# Patient Record
Sex: Female | Born: 2000 | Race: White | Hispanic: No | Marital: Single | Smoking: Never smoker
Health system: Southern US, Community
[De-identification: ages and names within clinical notes are randomized; demographics above are authoritative.]

## PROBLEM LIST (undated history)

## (undated) DIAGNOSIS — J45909 Unspecified asthma, uncomplicated: Secondary | ICD-10-CM

## (undated) DIAGNOSIS — T7840XA Allergy, unspecified, initial encounter: Secondary | ICD-10-CM

## (undated) HISTORY — PX: KNEE DISLOCATION SURGERY: SHX689

## (undated) HISTORY — DX: Allergy, unspecified, initial encounter: T78.40XA

## (undated) HISTORY — DX: Unspecified asthma, uncomplicated: J45.909

---
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
## 2018-02-24 DIAGNOSIS — R05 Cough: Secondary | ICD-10-CM | POA: Diagnosis not present

## 2018-02-24 DIAGNOSIS — J4531 Mild persistent asthma with (acute) exacerbation: Secondary | ICD-10-CM | POA: Diagnosis not present

## 2018-02-24 DIAGNOSIS — J018 Other acute sinusitis: Secondary | ICD-10-CM | POA: Diagnosis not present

## 2018-02-24 DIAGNOSIS — Z6834 Body mass index (BMI) 34.0-34.9, adult: Secondary | ICD-10-CM | POA: Diagnosis not present

## 2018-02-24 DIAGNOSIS — R070 Pain in throat: Secondary | ICD-10-CM | POA: Diagnosis not present

## 2018-02-24 DIAGNOSIS — E669 Obesity, unspecified: Secondary | ICD-10-CM | POA: Diagnosis not present

## 2018-02-24 DIAGNOSIS — Z6838 Body mass index (BMI) 38.0-38.9, adult: Secondary | ICD-10-CM | POA: Diagnosis not present

## 2018-04-02 DIAGNOSIS — J4530 Mild persistent asthma, uncomplicated: Secondary | ICD-10-CM | POA: Diagnosis not present

## 2018-04-02 DIAGNOSIS — J45902 Unspecified asthma with status asthmaticus: Secondary | ICD-10-CM | POA: Diagnosis not present

## 2018-04-02 DIAGNOSIS — Z6839 Body mass index (BMI) 39.0-39.9, adult: Secondary | ICD-10-CM | POA: Diagnosis not present

## 2018-06-03 ENCOUNTER — Other Ambulatory Visit: Payer: Self-pay

## 2018-06-03 ENCOUNTER — Emergency Department (HOSPITAL_COMMUNITY)
Admission: EM | Admit: 2018-06-03 | Discharge: 2018-06-03 | Disposition: A | Discharge status: Home / Self Care | Payer: Medicaid Other | Attending: Emergency Medicine | Admitting: Emergency Medicine

## 2018-06-03 ENCOUNTER — Encounter (HOSPITAL_COMMUNITY): Payer: Self-pay | Admitting: *Deleted

## 2018-06-03 DIAGNOSIS — R1031 Right lower quadrant pain: Secondary | ICD-10-CM | POA: Diagnosis not present

## 2018-06-03 DIAGNOSIS — K922 Gastrointestinal hemorrhage, unspecified: Secondary | ICD-10-CM | POA: Insufficient documentation

## 2018-06-03 DIAGNOSIS — Z79899 Other long term (current) drug therapy: Secondary | ICD-10-CM | POA: Diagnosis not present

## 2018-06-03 DIAGNOSIS — K625 Hemorrhage of anus and rectum: Secondary | ICD-10-CM | POA: Diagnosis present

## 2018-06-03 LAB — URINALYSIS, ROUTINE W REFLEX MICROSCOPIC
Bilirubin Urine: NEGATIVE
Glucose, UA: NEGATIVE mg/dL
Hgb urine dipstick: NEGATIVE
Ketones, ur: NEGATIVE mg/dL
Leukocytes,Ua: NEGATIVE
Nitrite: NEGATIVE
Protein, ur: NEGATIVE mg/dL
Specific Gravity, Urine: 1.011 (ref 1.005–1.030)
pH: 5 (ref 5.0–8.0)

## 2018-06-03 LAB — COMPREHENSIVE METABOLIC PANEL
ALT: 23 U/L (ref 0–44)
AST: 23 U/L (ref 15–41)
Albumin: 4.8 g/dL (ref 3.5–5.0)
Alkaline Phosphatase: 80 U/L (ref 38–126)
Anion gap: 14 (ref 5–15)
BUN: 9 mg/dL (ref 6–20)
CO2: 22 mmol/L (ref 22–32)
Calcium: 10.1 mg/dL (ref 8.9–10.3)
Chloride: 103 mmol/L (ref 98–111)
Creatinine, Ser: 0.85 mg/dL (ref 0.44–1.00)
GFR calc Af Amer: >60 mL/min (ref >60)
GFR calc non Af Amer: >60 mL/min (ref >60)
Glucose, Bld: 78 mg/dL (ref 70–99)
Potassium: 4.1 mmol/L (ref 3.5–5.1)
Sodium: 139 mmol/L (ref 135–145)
Total Bilirubin: 0.5 mg/dL (ref 0.3–1.2)
Total Protein: 8.4 g/dL — ABNORMAL HIGH (ref 6.5–8.1)

## 2018-06-03 LAB — CBC WITH DIFFERENTIAL/PLATELET
Abs Immature Granulocytes: 0.02 10*3/uL (ref 0.00–0.07)
Basophils Absolute: 0 10*3/uL (ref 0.0–0.1)
Basophils Relative: 0 %
Eosinophils Absolute: 0.1 10*3/uL (ref 0.0–0.5)
Eosinophils Relative: 1 %
HCT: 43 % (ref 36.0–46.0)
Hemoglobin: 14.3 g/dL (ref 12.0–15.0)
Immature Granulocytes: 0 %
Lymphocytes Relative: 33 %
Lymphs Abs: 3.2 10*3/uL (ref 0.7–4.0)
MCH: 30 pg (ref 26.0–34.0)
MCHC: 33.3 g/dL (ref 30.0–36.0)
MCV: 90.1 fL (ref 80.0–100.0)
Monocytes Absolute: 0.7 10*3/uL (ref 0.1–1.0)
Monocytes Relative: 7 %
Neutro Abs: 5.8 10*3/uL (ref 1.7–7.7)
Neutrophils Relative %: 59 %
Platelets: 314 10*3/uL (ref 150–400)
RBC: 4.77 MIL/uL (ref 3.87–5.11)
RDW: 11.8 % (ref 11.5–15.5)
WBC: 9.8 10*3/uL (ref 4.0–10.5)
nRBC: 0 % (ref 0.0–0.2)

## 2018-06-03 LAB — POC OCCULT BLOOD, ED: Fecal Occult Bld: NEGATIVE

## 2018-06-03 LAB — HCG, QUANTITATIVE, PREGNANCY: hCG, Beta Chain, Quant, S: <1 m[IU]/mL (ref <5)

## 2018-06-03 LAB — LIPASE, BLOOD: Lipase: 27 U/L (ref 11–51)

## 2018-06-03 NOTE — Discharge Instructions (Signed)
You have been diagnosed today with Lower Gastrointestinal Bleeding.  At this time there does not appear to be the presence of an emergent medical condition, however there is always the potential for conditions to change. Please read and follow the below instructions.  Please return to the Emergency Department immediately for any new or worsening symptoms. Please be sure to follow up with your Primary Care Provider within one week regarding your visit today; please call their office to schedule an appointment even if you are feeling better for a follow-up visit. Please call the GI specialist at [facility] gastroenterology to schedule a follow-up appointment.  Call their office tomorrow to schedule an appointment for within the next week.  Return to the emergency department for any new or worsening symptoms including pain. As we discussed due to your allergy to triamcinolone we will hold off on prescribing you Anusol to treat hemorrhoids at this time as do not make your condition worse however we suggest he discuss this treatment with your primary care provider as well as the gastroenterologist as it may be helpful.  Get help right away if: You have new or increased rectal bleeding. You have black or dark red stools. You vomit blood or something that looks like coffee grounds. You have pain or tenderness in your abdomen. You have a fever. You feel weak. You feel nauseous. You faint. You have severe pain in your rectum. You cannot have a bowel movement. Any new/concerning or worsening symptoms  Please read the additional information packets attached to your discharge summary.

## 2018-06-03 NOTE — ED Notes (Signed)
Patient verbalizes understanding of discharge instructions. Opportunity for questioning and answers were provided. Armband removed by staff, pt discharged from ED.  

## 2018-06-03 NOTE — ED Provider Notes (Signed)
[facility] EMERGENCY DEPARTMENT Provider Note   CSN: [id number] Arrival date & time: 06/03/18  1757    History   Chief Complaint Chief Complaint  Patient presents with  . GI Bleeding    HPI Anna Roberts is a 18 y.o. female presenting today with painless bright red blood per rectum that began on 05/30/2018.  Patient reports that she first noticed a small amount of bright red blood on her toilet paper on 05/30/2018.  Patient reports that with each bowel movement over the past 4 days she has noticed a small amount of bright red blood in the toilet.  She denies any pain associated with her bleeding and she has never had this before.  Patient denies any history of hemorrhoids, fissures or injury.  Patient denies any dizziness, lightheadedness, chest pain, shortness of breath or additional concerns.  Patient reports that she is an otherwise healthy 18 year old female without chronic medical conditions.  On review of symptoms patient does report that she had a cramping right lower quadrant pain with some nausea that occurred 2 days ago, she reports it lasted a few moments before self resolving and has not reoccurred.  Patient denies any family history of gastrointestinal cancers.     HPI  History reviewed. No pertinent past medical history.  Patient Active Problem List   Diagnosis Date Noted  . STREP THROAT 04/03/2009  . SORE THROAT 04/03/2009  . OBESITY, UNSPECIFIED 05/26/2008  . ALLERGIC RHINITIS 05/26/2008     OB History   No obstetric history on file.      Home Medications    Prior to Admission medications   Medication Sig Start Date End Date Taking? Authorizing Provider  diphenhydrAMINE (BENADRYL) 25 MG tablet Take 50 mg by mouth every 6 (six) hours as needed for itching or allergies (cats).   Yes [provider]  etonogestrel (NEXPLANON) 68 MG IMPL implant 1 each by Subdermal route once.   Yes [provider]  ibuprofen (ADVIL) 200 MG  tablet Take 400 mg by mouth every 6 (six) hours as needed for headache or moderate pain.   Yes [provider]  loratadine (CLARITIN) 10 MG tablet Take 1 tablet by mouth daily.   Yes [provider]  montelukast (SINGULAIR) 10 MG tablet Take 1 tablet by mouth daily. 05/24/18  Yes [provider]  PROAIR HFA 108 (90 Base) MCG/ACT inhaler Inhale 2 puffs into the lungs every 4 (four) hours as needed for wheezing or shortness of breath. 04/02/18  Yes [provider]  SYMBICORT 160-4.5 MCG/ACT inhaler Inhale 2 puffs into the lungs 2 (two) times daily. 04/30/18  Yes [provider]    Family History History reviewed. No pertinent family history.  Social History Social History   Tobacco Use  . Smoking status: Not on file  Substance Use Topics  . Alcohol use: Not on file  . Drug use: Not on file     Allergies   Triamcinolone acetonide   Review of Systems Review of Systems  Constitutional: Negative.  Negative for chills and fever.  Respiratory: Negative.  Negative for cough and shortness of breath.   Cardiovascular: Negative.  Negative for chest pain.  Gastrointestinal: Positive for anal bleeding and blood in stool. Negative for abdominal distention, abdominal pain (Brief moment of RLQ cramping 2 days ago), diarrhea, nausea and vomiting.  Genitourinary: Negative.  Negative for dysuria, flank pain, hematuria, pelvic pain, vaginal bleeding and vaginal discharge.  Neurological: Negative.  Negative for dizziness,  syncope, weakness, light-headedness and headaches.  All other systems reviewed and are negative.  Physical Exam Updated Vital Signs BP 133/76 (BP Location: Right Arm)   Pulse 86   Temp 98.3 F (36.8 C)   Resp 18   SpO2 100%   Physical Exam Constitutional:      General: She is not in acute distress.    Appearance: Normal appearance. She is well-developed. She is obese. She is not ill-appearing or diaphoretic.  HENT:     Head:  Normocephalic and atraumatic.     Right Ear: External ear normal.     Left Ear: External ear normal.     Nose: Nose normal.     Mouth/Throat:     Mouth: Mucous membranes are moist.     Pharynx: Oropharynx is clear.  Eyes:     General: Vision grossly intact. Gaze aligned appropriately.     Pupils: Pupils are equal, round, and reactive to light.  Neck:     Musculoskeletal: Normal range of motion.     Trachea: Trachea and phonation normal. No tracheal deviation.  Cardiovascular:     Rate and Rhythm: Normal rate and regular rhythm.     Pulses: Normal pulses.     Heart sounds: Normal heart sounds.  Pulmonary:     Effort: Pulmonary effort is normal. No respiratory distress.     Breath sounds: Normal breath sounds.  Abdominal:     General: Bowel sounds are normal. There is no distension.     Palpations: Abdomen is soft.     Tenderness: There is no abdominal tenderness. There is no right CVA tenderness, left CVA tenderness, guarding or rebound. Negative signs include Murphy's sign, Rovsing's sign and McBurney's sign.     Comments: Obese abdomen  Genitourinary:    Comments: Rectal examination chaperoned by [facility] RN.  No gross blood on examination.  No external hemorrhoids, fissures or signs of injury.  Internal examination with appropriate tone, no palpable hemorrhoids fissures or other abnormalities. - Pelvic examination deferred by patient. Musculoskeletal: Normal range of motion.  Skin:    General: Skin is warm and dry.  Neurological:     Mental Status: She is alert.     GCS: GCS eye subscore is 4. GCS verbal subscore is 5. GCS motor subscore is 6.     Comments: Speech is clear and goal oriented, follows commands Major Cranial nerves without deficit, no facial droop Moves extremities without ataxia, coordination intact  Psychiatric:        Behavior: Behavior normal.    ED Treatments / Results  Labs (all labs ordered are listed, but only abnormal results are displayed) Labs  Reviewed  COMPREHENSIVE METABOLIC PANEL - Abnormal; Notable for the following components:      Result Value   Total Protein 8.4 (*)    All other components within normal limits  URINALYSIS, ROUTINE W REFLEX MICROSCOPIC - Abnormal; Notable for the following components:   Color, Urine STRAW (*)    All other components within normal limits  CBC WITH DIFFERENTIAL/PLATELET  LIPASE, BLOOD  HCG, QUANTITATIVE, PREGNANCY  POC OCCULT BLOOD, ED    EKG None  Radiology No results found.  Procedures Procedures (including critical care time)  Medications Ordered in ED Medications - No data to display   Initial Impression / Assessment and Plan / ED Course  I have reviewed the triage vital signs and the nursing notes.  Pertinent labs & imaging results that were available during my care of the patient were  reviewed by me and considered in my medical decision making (see chart for details).    18 year old female presenting with small amounts of painless bright red blood per rectum x4 days.  She did note a brief episode of RLQ cramping sensation 2 days ago that has not reoccurred.  Physical examination today reveals a well-appearing obese female in no acute distress.  Abdominal examination today reveals a soft nontender abdomen without distention or peritoneal signs.  She has no right lower quadrant tenderness on my examination.  Rectal examination without evidence of bleeding, fissures, hemorrhoids or other abnormalities. - CBC within normal limits CMP nonacute Lipase within normal limits Beta-hCG negative Hemoccult negative Urinalysis unremarkable - Vital signs within normal limits. - Case discussed with Dr. Particia NearingHaviland who recommends Anusol and GI follow-up, no further imaging or work-up indicated at this time.  Suspect patient with possible internal hemorrhoid. - Plan of care discussed with the patient, she is on reexamination of the abdomen she has a soft nontender abdomen without  distention or peritoneal signs.  Shared decision making performed regarding CT abdomen pelvis, low suspicion for appendicitis, diverticulitis extraction or other acute intra-abdominal pathologies of patient's symptoms additionally CT abdomen pelvis would be low yield for evaluation of GI bleed, patient wishes to be discharged at this time with GI referral and does not want to have CT scan today. I have low suspicion for appendicitis, diverticulitis, obstruction or other acute abdominal pelvic etiologies patient symptoms today.  Unfortunately patient has allergy to triamcinolone and has never had other steroids in the past.  She has elected to wait until PCP and GI follow-up regarding her bleeding prior to starting Anusol.  At this time there does not appear to be any evidence of an acute emergency medical condition and the patient appears stable for discharge with appropriate outpatient follow up. Diagnosis was discussed with patient who verbalizes understanding of care plan and is agreeable to discharge. I have discussed return precautions with patient who verbalizes understanding of return precautions. Patient encouraged to follow-up with their PCP and GI. All questions answered.  Patient has been discharged in good condition.  Patient's case discussed with Dr. Particia NearingHaviland who agrees with plan to discharge with follow-up.   Note: Portions of this report may have been transcribed using voice recognition software. Every effort was made to ensure accuracy; however, inadvertent computerized transcription errors may still be present. Final Clinical Impressions(s) / ED Diagnoses   Final diagnoses:  Lower GI bleed    ED Discharge Orders    None       Elizabeth PalauMorelli, Salaam Battershell A, PA-C 06/03/18 2303    Jacalyn LefevreHaviland, Julie, MD 06/06/18 [phone]

## 2018-06-03 NOTE — ED Triage Notes (Signed)
Pt in c/o blood in her stool for the last week, reports the first time she just noted it on her TP and it gradually increased and began to notice the blood in the toilet during her bowel movements, denies rectal pain during bowel movement, c/o RLQ pain that she describes as cramping though, reports nausea as well

## 2018-06-04 ENCOUNTER — Telehealth: Payer: Self-pay

## 2018-06-04 NOTE — Telephone Encounter (Signed)
Patient returned phone call and reported continued concerns with abdominal pain, nausea, and bleeding. States a CT scan was not completed and she still does not know what is causing her concerns. PCP is unavailable for same day appt. Scheduled ED f/u appt with PCP on 06/04/18 @ 1020.

## 2018-06-04 NOTE — Telephone Encounter (Signed)
Patient seen in [facility] for lower GI bleed. Discharged with referral for GI and advised to f/u with PCP. GI appt scheduled on 06/08/18. Attempted to reach patient to schedule ED f/u appt with PCP. Attempt unsuccessful. VM is full.

## 2018-06-05 ENCOUNTER — Other Ambulatory Visit: Payer: Self-pay

## 2018-06-05 ENCOUNTER — Ambulatory Visit (INDEPENDENT_AMBULATORY_CARE_PROVIDER_SITE_OTHER): Payer: Medicaid Other | Admitting: Family Medicine

## 2018-06-05 ENCOUNTER — Encounter: Payer: Self-pay | Admitting: *Deleted

## 2018-06-05 ENCOUNTER — Encounter: Payer: Self-pay | Admitting: Family Medicine

## 2018-06-05 VITALS — BP 106/62 | HR 87 | Temp 98.6°F | Ht 70.0 in | Wt 275.2 lb

## 2018-06-05 DIAGNOSIS — R1033 Periumbilical pain: Secondary | ICD-10-CM | POA: Insufficient documentation

## 2018-06-05 DIAGNOSIS — K648 Other hemorrhoids: Secondary | ICD-10-CM | POA: Insufficient documentation

## 2018-06-05 MED ORDER — HYDROCORTISONE 2.5 % RE CREA: 1.0000 "application " | TOPICAL_CREAM | Freq: Two times a day (BID) | RECTAL | 0 refills | Status: DC

## 2018-06-05 NOTE — Assessment & Plan Note (Signed)
No clear constipation, but likely brbpr due to internal hemorrhoid. Pt obese and weight may be contributing.  Treat with topical hydrocortisone topical internally, fiber and water.  See discussion on abdominal pain.

## 2018-06-05 NOTE — Assessment & Plan Note (Signed)
Not explained by int hemorrhoid.  neg labs , UA, upreg in ER.  No clear red flags suggesting need for urgent CT.  Will await GI recommendations.

## 2018-06-05 NOTE — Patient Instructions (Addendum)
Apply topical steroid to rectum as directed. Increase water and fiber in diet.  Keep virtual appointment with GI next week.  If severe abdominal pain go to ER.

## 2018-06-05 NOTE — Progress Notes (Signed)
VIRTUAL VISIT Due to national recommendations of social distancing due to COVID 19, a virtual visit is felt to be most appropriate for this patient at this time.   I connected with the patient on 06/05/18 at 10:20 AM EDT by virtual telehealth platform and verified that I am speaking with the correct person using two identifiers.   I discussed the limitations, risks, security and privacy concerns of performing an evaluation and management service by  virtual telehealth platform and the availability of in person appointments. I also discussed with the patient that there may be a patient responsible charge related to this service. The patient expressed understanding and agreed to proceed.  Patient location: Home Provider [facility] Participants: Kerby NoraAmy Brittane Grudzinski and Lovett CalenderSarah K Thum   Chief Complaint  Patient presents with  . Follow-up    ED-Still abdominal pain/nausea/bleeding    History of Present Illness:  18 year old female presents for ER follow up.  Seen in ER on 06/03/2018 for lower GI bleed and cramping in right lower quadrant Summary of presentation to ER copied as follows: Patient reports that she first noticed a small amount of bright red blood on her toilet paper on 05/30/2018.  Patient reports that with each bowel movement over the past 4 days she has noticed a small amount of bright red blood in the toilet.  She denies any pain associated with her bleeding and she has never had this before.  Patient denies any history of hemorrhoids, fissures or injury.   no constipation, nod ierrheaPatient denies any dizziness, lightheadedness, chest pain, shortness of breath or additional concerns.   On review of symptoms patient does report that she had a cramping right lower quadrant pain with some nausea that occurred 2 days ago, she reports it lasted a few moments before self resolving.   CBC, CMET, lipase, UA, Upreg and POC stool test .. all negative Exam in ER : No gross blood on  examination.  No external hemorrhoids, fissures or signs of injury.  Internal examination with appropriate tone, no palpable hemorrhoids fissures or other abnormalities. NO INTERNAL EXAM VISUALLY PERFORMED.  Could not treat with anusol given hx of steroid allergy. Recommended follow up with PCP and referral to GI.  06/05/18   She has continued to note brbpr  drops in toilet.. more than a few drops, but less than a 1/2 cup. No diarrhea, no constipation ( one BM a day, no straining), fairly soft stool). occ nausea, no emesis.  no rectal pain.  No fever. She continues to have pain before and after BM.. right mid lto upper abdomen now.. dull pain.  Bloating, cramping pain increasing some... on pain scale 5/10. No flu like symptoms.  No personal history of GI issues. Family history of GI issue: Mom and aunt with extremal hemorrhoids..    Has virtual visit with GI on 06/08/2018   After beginning the appointment virtually.. pt was brought in to office for exam and anoscopy.  COVID 19 screen No recent travel or known exposure to COVID19 The patient denies respiratory symptoms of COVID 19 at this time.  The importance of social distancing was discussed today.   Review of Systems  Constitutional: Negative for chills and fever.  HENT: Negative for congestion and ear pain.   Eyes: Negative for pain and redness.  Respiratory: Negative for cough and shortness of breath.   Cardiovascular: Negative for chest pain, palpitations and leg swelling.  Gastrointestinal: Positive for abdominal pain and blood in stool.  Negative for constipation, diarrhea, nausea and vomiting.  Genitourinary: Negative for dysuria.  Musculoskeletal: Negative for falls and myalgias.  Skin: Negative for rash.  Neurological: Negative for dizziness.  Psychiatric/Behavioral: Negative for depression. The patient is not nervous/anxious.       History reviewed. No pertinent past medical history.  reports that she is a  non-smoker but has been exposed to tobacco smoke. She has never used smokeless tobacco. She reports that she does not drink alcohol.   Current Outpatient Medications:  .  diphenhydrAMINE (BENADRYL) 25 MG tablet, Take 50 mg by mouth every 6 (six) hours as needed for itching or allergies (cats)., Disp: , Rfl:  .  etonogestrel (NEXPLANON) 68 MG IMPL implant, 1 each by Subdermal route once., Disp: , Rfl:  .  ibuprofen (ADVIL) 200 MG tablet, Take 400 mg by mouth every 6 (six) hours as needed for headache or moderate pain., Disp: , Rfl:  .  loratadine (CLARITIN) 10 MG tablet, Take 1 tablet by mouth daily., Disp: , Rfl:  .  montelukast (SINGULAIR) 10 MG tablet, Take 1 tablet by mouth daily., Disp: , Rfl:  .  PROAIR HFA 108 (90 Base) MCG/ACT inhaler, Inhale 2 puffs into the lungs every 4 (four) hours as needed for wheezing or shortness of breath., Disp: , Rfl:  .  SYMBICORT 160-4.5 MCG/ACT inhaler, Inhale 2 puffs into the lungs 2 (two) times daily., Disp: , Rfl:    Observations/Objective: Pulse (!) 112, height 5\' 10"  (1.778 m), weight 275 lb 4 oz (124.9 kg).  Physical Exam Constitutional:      General: She is not in acute distress.    Appearance: Normal appearance. She is well-developed. She is not ill-appearing or toxic-appearing.  HENT:     Head: Normocephalic.     Right Ear: Hearing, tympanic membrane, ear canal and external ear normal. Tympanic membrane is not erythematous, retracted or bulging.     Left Ear: Hearing, tympanic membrane, ear canal and external ear normal. Tympanic membrane is not erythematous, retracted or bulging.     Nose: No mucosal edema or rhinorrhea.     Right Sinus: No maxillary sinus tenderness or frontal sinus tenderness.     Left Sinus: No maxillary sinus tenderness or frontal sinus tenderness.     Mouth/Throat:     Pharynx: Uvula midline.  Eyes:     General: Lids are normal. Lids are everted, no foreign bodies appreciated.     Conjunctiva/sclera: Conjunctivae  normal.     Pupils: Pupils are equal, round, and reactive to light.  Neck:     Musculoskeletal: Normal range of motion and neck supple.     Thyroid: No thyroid mass or thyromegaly.     Vascular: No carotid bruit.     Trachea: Trachea normal.  Cardiovascular:     Rate and Rhythm: Normal rate and regular rhythm.     Pulses: Normal pulses.     Heart sounds: Normal heart sounds, S1 normal and S2 normal. No murmur. No friction rub. No gallop.   Pulmonary:     Effort: Pulmonary effort is normal. No tachypnea or respiratory distress.     Breath sounds: Normal breath sounds. No decreased breath sounds, wheezing, rhonchi or rales.  Abdominal:     General: Bowel sounds are normal.     Palpations: Abdomen is soft.     Tenderness: There is abdominal tenderness in the periumbilical area. There is no guarding or rebound.     Hernia: No hernia is present.  Genitourinary:  Rectum: Guaiac result negative. Internal hemorrhoid present. No mass, tenderness, anal fissure or external hemorrhoid.     Comments:   Procedure: Anoscopy: After explaining the procedure, informed consent was obtained.   Using the anoscope instrument, anoscopy was carried out.  Proceeded to 2-3 cm.  Findings:non bleeding grade 1 hemorrhoids No complications were encountered;  the procedure was well  tolerated.    Skin:    General: Skin is warm and dry.     Findings: No rash.  Neurological:     Mental Status: She is alert.  Psychiatric:        Mood and Affect: Mood is not anxious or depressed.        Speech: Speech normal.        Behavior: Behavior normal. Behavior is cooperative.        Thought Content: Thought content normal.        Judgment: Judgment normal.      Assessment and Plan Internal hemorrhoid No clear constipation, but likely brbpr due to internal hemorrhoid. Pt obese and weight may be contributing.  Treat with topical hydrocortisone topical internally, fiber and water.  See discussion on abdominal  pain.     Abdominal pain, periumbilical Not explained by int hemorrhoid.  neg labs , UA, upreg in ER.  No clear red flags suggesting need for urgent CT.  Will await GI recommendations.     I discussed the assessment and treatment plan with the patient. The patient was provided an opportunity to ask questions and all were answered. The patient agreed with the plan and demonstrated an understanding of the instructions.   The patient was advised to call back or seek an in-person evaluation if the symptoms worsen or if the condition fails to improve as anticipated.     Kerby Nora, MD

## 2018-06-08 ENCOUNTER — Other Ambulatory Visit: Payer: Self-pay

## 2018-06-08 ENCOUNTER — Ambulatory Visit (INDEPENDENT_AMBULATORY_CARE_PROVIDER_SITE_OTHER): Payer: Medicaid Other | Admitting: Gastroenterology

## 2018-06-08 ENCOUNTER — Encounter: Payer: Self-pay | Admitting: Gastroenterology

## 2018-06-08 VITALS — Ht 70.0 in | Wt 275.0 lb

## 2018-06-08 DIAGNOSIS — R1084 Generalized abdominal pain: Secondary | ICD-10-CM | POA: Diagnosis not present

## 2018-06-08 DIAGNOSIS — K625 Hemorrhage of anus and rectum: Secondary | ICD-10-CM

## 2018-06-08 MED ORDER — NA SULFATE-K SULFATE-MG SULF 17.5-3.13-1.6 GM/177ML PO SOLN: 1.0000 | ORAL | 0 refills | Status: DC

## 2018-06-08 MED ORDER — DICYCLOMINE HCL 20 MG PO TABS: 20.0000 mg | ORAL_TABLET | Freq: Three times a day (TID) | ORAL | 3 refills | Status: DC

## 2018-06-08 NOTE — Progress Notes (Signed)
TELEHEALTH VISIT  Referring Provider: Excell SeltzerBedsole, Amy E, MD Primary Care Physician:  Excell SeltzerBedsole, Amy E, MD   Tele-visit due to COVID-19 pandemic Patient requested visit virtually, consented to the virtual encounter via video enabled telemedicine application (Zoom, converted to audio call due to failure to video to work) Contact made at: 2:21PM 06/08/18 Patient verified by name and date of birth Location of patient: Home Location provider: [facility] Names of persons participating: Me, patient, Deneise LeverDesiree Brown CMA Time spent on telehealth visit: 28 minutes I discussed the limitations of evaluation and management by telemedicine. The patient expressed understanding and agreed to proceed.  Reason for Consultation: Lower GI bleed   IMPRESSION:  Abdominal pain Painless rectal bleeding Internal hemorrhoids  Will start evaluation for cause of pain and bleeding with colonoscopy +/- EGD. If nondiagnostic, or symptoms progress prior to our ability to schedule endoscopic evaluation, will proceed with contrasted CT scan of the abd/pelvis.    PLAN: Trial of Bentyl 20 mg QID PRN pain Continue Anusol HC at this time Colonoscopy and upper endoscopy CT of the abd/pelvis with contrast if symptoms progress prior to endoscopy   I consented the patient discussing the risks, benefits, and alternatives to endoscopic evaluation. In particular, we discussed the risks that include, but are not limited to, reaction to medication, cardiopulmonary compromise, bleeding requiring blood transfusion, aspiration resulting in pneumonia, perforation requiring surgery, lack of diagnosis, severe illness requiring hospitalization, and even death. We reviewed the risk of missed lesion including polyps or even cancer. The patient acknowledges these risks and asks that we proceed.      HPI: Anna Roberts is a 18 y.o. female referred by Dr. Ermalene SearingBedsole for further evaluation of abdominal pain.  The history is  obtained through the patient and review of her electronic health record. 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   She was seen in the ED 06/03/2022 bright red blood with every bowel movement and mid right to lower right-sided abdominal pain.  The ER rectal exam notes no external hemorrhoids fissures or abnormalities.  No palpable internal hemorrhoids.  They did not give her Anusol because of a history of steroid allergy.  She followed up with Dr. Ermalene SearingBedsole 06/05/2018.  She was continuing to have bright red blood in the toilet bowl.  She reported to Dr. Ermalene SearingBedsole no rectal pain but she did have abdominal pain with defecation.  There is associated bloating.  Dr. Daphine DeutscherBedsole's rectal exam and anoscopy revealed internal hemorrhoids and guaiac negative stool.  There was no mass, tenderness, or fissure noted.  She was treated with topical hydrocortisone, fiber, and diet.  However she did not feel that her abdominal pain could be explained by the hemorrhoid.  The patient reports Intermittent, dull or crampy middle to lower right-sided pain. Increased pain with deep palpation over this area. Present for the last 7 days.  Associated nausea. No change in bowel habits.  No pain or relief of pain with defecation.  Some relief with Tylenol. No change with eating, movement, or position.   No fevers, chills, or night sweats. Normal energy. Appetite fluctuates. Weight is at there baseline of 275 pounds. No other associated symptoms. No identified exacerbating or relieving features.   No abdominal imaging.  No prior endoscopic evaluation. Labs from 06/03/18 showed a normal CMP including normal LFTs and normal lipase. CBC was normal with WBC 9.8, hgb 14.3, platelets 314. Albumin 4.8.  Mother  and aunt have external hemorrhoids.  No known family history of colon cancer or polyps. No family history of uterine/endometrial cancer, pancreatic cancer or  gastric/stomach cancer. No family history of autoimmune disease.   Past Medical History:  Diagnosis Date  . Asthma     Past Surgical History:  Procedure Laterality Date  . KNEE DISLOCATION SURGERY      Current Outpatient Medications  Medication Sig Dispense Refill  . loratadine (CLARITIN) 10 MG tablet Take 1 tablet by mouth daily.    . montelukast (SINGULAIR) 10 MG tablet Take 1 tablet by mouth daily.    Marland Kitchen PROAIR HFA 108 (90 Base) MCG/ACT inhaler Inhale 2 puffs into the lungs every 4 (four) hours as needed for wheezing or shortness of breath.    . SYMBICORT 160-4.5 MCG/ACT inhaler Inhale 2 puffs into the lungs 2 (two) times daily.    . diphenhydrAMINE (BENADRYL) 25 MG tablet Take 50 mg by mouth every 6 (six) hours as needed for itching or allergies (cats).    Marland Kitchen etonogestrel (NEXPLANON) 68 MG IMPL implant 1 each by Subdermal route once.    . hydrocortisone (ANUSOL-HC) 2.5 % rectal cream Place 1 application rectally 2 (two) times daily. Provide applicator for internal hemorrhoids. (Patient not taking: Reported on 06/08/2018) 30 g 0  . ibuprofen (ADVIL) 200 MG tablet Take 400 mg by mouth every 6 (six) hours as needed for headache or moderate pain.     No current facility-administered medications for this visit.     Allergies as of 06/08/2018 - Review Complete 06/08/2018  Allergen Reaction Noted  . Triamcinolone acetonide Other (See Comments) 11/22/2014    Family History  Problem Relation Age of Onset  . Diabetes Maternal Aunt     Social History   Socioeconomic History  . Marital status: Single    Spouse name: Not on file  . Number of children: Not on file  . Years of education: Not on file  . Highest education level: Not on file  Occupational History  . Occupation: Librarian, academic  Social Needs  . Financial resource strain: Not on file  . Food insecurity:    Worry: Not on file    Inability: Not on file  . Transportation needs:    Medical: Not on file    Non-medical: Not on  file  Tobacco Use  . Smoking status: Passive Smoke Exposure - Never Smoker  . Smokeless tobacco: Never Used  Substance and Sexual Activity  . Alcohol use: Never    Frequency: Never  . Drug use: Not on file  . Sexual activity: Not on file  Lifestyle  . Physical activity:    Days per week: Not on file    Minutes per session: Not on file  . Stress: Not on file  Relationships  . Social connections:    Talks on phone: Not on file    Gets together: Not on file    Attends religious service: Not on file    Active member of club or organization: Not on file    Attends meetings of clubs or organizations: Not on file    Relationship status: Not on file  . Intimate partner violence:    Fear of current or ex partner: Not on file    Emotionally abused: Not on file    Physically abused: Not on file    Forced sexual activity: Not on file  Other Topics Concern  . Not on file  Social History Narrative  . Not on file  Review of Systems: ALL ROS discussed and all others negative except listed in HPI. No extra-GI manifestations of IBD.  Physical Exam: General: in no acute distress Neuro: Alert and appropriate Psych: Normal affect and normal insight Exam otherwise limited due to telehealth encounter.   Donovyn Guidice L. Orvan Falconer, MD, MPH [facility] 06/08/2018, 12:43 PM

## 2018-06-08 NOTE — Patient Instructions (Addendum)
I have recommended a colonoscopy and upper endoscopy for further evaluation of your pain and bleeding. We will call you to schedule these procedures.  Please try Bentyl 20 mg up to four times daily for the abdominal pain.  Continue to use the Anusol [facility] as prescribed by Dr. Ermalene Searing.   Thank you for your patience with me and our technology today! Please stay home, safe, and healthy. I look forward to meeting you in person in the future.

## 2018-06-28 ENCOUNTER — Telehealth: Payer: Self-pay | Admitting: *Deleted

## 2018-06-28 NOTE — Telephone Encounter (Addendum)
Attempted to reach patient to complete pre procedure screening. No answer. Mailbox full, unable to leave message.  Covid-19 screening questions  Have you traveled in the last 14 days? No If yes where?  Do you now or have you had a fever in the last 14 days? No  Do you have any respiratory symptoms of shortness of breath or cough now or in the last 14 days? No  Do you have any family members or close contacts with diagnosed or suspected Covid-19 in the past 14 days? No  Have you been tested for Covid-19 and found to be positive? No

## 2018-06-30 ENCOUNTER — Other Ambulatory Visit: Payer: Self-pay

## 2018-06-30 ENCOUNTER — Ambulatory Visit (AMBULATORY_SURGERY_CENTER): Payer: Medicaid Other | Admitting: Gastroenterology

## 2018-06-30 ENCOUNTER — Encounter: Payer: Self-pay | Admitting: Gastroenterology

## 2018-06-30 VITALS — BP 114/66 | HR 71 | Temp 98.8°F | Resp 12 | Ht 70.0 in | Wt 275.0 lb

## 2018-06-30 DIAGNOSIS — K649 Unspecified hemorrhoids: Secondary | ICD-10-CM | POA: Diagnosis not present

## 2018-06-30 DIAGNOSIS — R1084 Generalized abdominal pain: Secondary | ICD-10-CM | POA: Diagnosis not present

## 2018-06-30 DIAGNOSIS — K602 Anal fissure, unspecified: Secondary | ICD-10-CM

## 2018-06-30 DIAGNOSIS — K3189 Other diseases of stomach and duodenum: Secondary | ICD-10-CM

## 2018-06-30 DIAGNOSIS — K625 Hemorrhage of anus and rectum: Secondary | ICD-10-CM

## 2018-06-30 DIAGNOSIS — K644 Residual hemorrhoidal skin tags: Secondary | ICD-10-CM

## 2018-06-30 DIAGNOSIS — K2970 Gastritis, unspecified, without bleeding: Secondary | ICD-10-CM

## 2018-06-30 DIAGNOSIS — K2950 Unspecified chronic gastritis without bleeding: Secondary | ICD-10-CM | POA: Diagnosis not present

## 2018-06-30 MED ORDER — PANTOPRAZOLE SODIUM 40 MG PO TBEC: 40.0000 mg | DELAYED_RELEASE_TABLET | Freq: Two times a day (BID) | ORAL | 0 refills | Status: DC

## 2018-06-30 MED ORDER — SODIUM CHLORIDE 0.9 % IV SOLN: 500.0000 mL | Freq: Once | INTRAVENOUS | Status: DC

## 2018-06-30 NOTE — Patient Instructions (Signed)
YOU HAD AN ENDOSCOPIC PROCEDURE TODAY AT THE [facility]:   Refer to the procedure report that was given to you for any specific questions about what was found during the examination.  If the procedure report does not answer your questions, please call your gastroenterologist to clarify.  If you requested that your care partner not be given the details of your procedure findings, then the procedure report has been included in a sealed envelope for you to review at your convenience later.  YOU SHOULD EXPECT: Some feelings of bloating in the abdomen. Passage of more gas than usual.  Walking can help get rid of the air that was put into your GI tract during the procedure and reduce the bloating. If you had a lower endoscopy (such as a colonoscopy or flexible sigmoidoscopy) you may notice spotting of blood in your stool or on the toilet paper. If you underwent a bowel prep for your procedure, you may not have a normal bowel movement for a few days.  Please Note:  You might notice some irritation and congestion in your nose or some drainage.  This is from the oxygen used during your procedure.  There is no need for concern and it should clear up in a day or so.  SYMPTOMS TO REPORT IMMEDIATELY:   Following lower endoscopy (colonoscopy or flexible sigmoidoscopy):  Excessive amounts of blood in the stool  Significant tenderness or worsening of abdominal pains  Swelling of the abdomen that is new, acute  Fever of 100F or higher   Following upper endoscopy (EGD)  Vomiting of blood or coffee ground material  New chest pain or pain under the shoulder blades  Painful or persistently difficult swallowing  New shortness of breath  Fever of 100F or higher  Black, tarry-looking stools  For urgent or emergent issues, a gastroenterologist can be reached at any hour by calling [phone].   DIET:  We do recommend a small meal at first, but then you may proceed to your regular diet.  Drink  plenty of fluids but you should avoid alcoholic beverages for 24 hours.  ACTIVITY:  You should plan to take it easy for the rest of today and you should NOT DRIVE or use heavy machinery until tomorrow (because of the sedation medicines used during the test).    FOLLOW UP: Our staff will call the number listed on your records 48-72 hours following your procedure to check on you and address any questions or concerns that you may have regarding the information given to you following your procedure. If we do not reach you, we will leave a message.  We will attempt to reach you two times.  During this call, we will ask if you have developed any symptoms of COVID 19. If you develop any symptoms (ie: fever, flu-like symptoms, shortness of breath, cough etc.) before then, please call [phone].  If you test positive for Covid 19 in the 2 weeks post procedure, please call and report this information to us.    If any biopsies were taken you will be contacted by phone or by letter within the next 1-3 weeks.  Please call us at [phone] if you have not heard about the biopsies in 3 weeks.    SIGNATURES/CONFIDENTIALITY: You and/or your care partner have signed paperwork which will be entered into your electronic medical record.  These signatures attest to the fact that that the information above on your After Visit Summary has been reviewed and is   understood.  Full responsibility of the confidentiality of this discharge information lies with you and/or your care-partner.    Handouts were given to your care partner on Gastritis and hemorrhoids. Add daily fiber supplement (Psyllium).  Recommended for stool bulking. Continue Bentyl. Avoid all NSAIDs including Ibuprofen. Add Pantoprazole 40 mg 2 times per day for 8 weeks. You may resume your other current medications today. Await biopsy results. Plan virtual encounter to review these results.  The office will call you back to set up this visit. Please  call if any questions or concerns.

## 2018-06-30 NOTE — Progress Notes (Signed)
Covid screening and temp done by CW. Vital signs done by JB 

## 2018-06-30 NOTE — Progress Notes (Signed)
No problems noted in the recovery room. maw 

## 2018-06-30 NOTE — Progress Notes (Signed)
Called to room to assist during endoscopic procedure.  Patient ID and intended procedure confirmed with present staff. Received instructions for my participation in the procedure from the performing physician.  

## 2018-06-30 NOTE — Progress Notes (Signed)
Spontaneous respirations throughout. VSS. Resting comfortably. To PACU on room air. Report to  RN. 

## 2018-06-30 NOTE — Op Note (Signed)
[facility] Patient Name: Anna Roberts Procedure Date: 06/30/2018 9:36 AM MRN: [id number] Endoscopist: Tressia Danas MD, MD Age: 18 Referring MD:  Date of Birth: March 13, 2000 Gender: Female Account #: [id number] Procedure:                Colonoscopy Indications:              Generalized abdominal pain, Rectal bleeding Medicines:                See the Anesthesia note for documentation of the                            administered medications Procedure:                Pre-Anesthesia Assessment:                           - Prior to the procedure, a History and Physical                            was performed, and patient medications and                            allergies were reviewed. The patient's tolerance of                            previous anesthesia was also reviewed. The risks                            and benefits of the procedure and the sedation                            options and risks were discussed with the patient.                            All questions were answered, and informed consent                            was obtained. Prior Anticoagulants: The patient has                            taken no previous anticoagulant or antiplatelet                            agents. ASA Grade Assessment: II - A patient with                            mild systemic disease. After reviewing the risks                            and benefits, the patient was deemed in                            satisfactory condition to undergo the procedure.  After obtaining informed consent, the colonoscope                            was passed under direct vision. Throughout the                            procedure, the patient's blood pressure, pulse, and                            oxygen saturations were monitored continuously. The                            Colonoscope was introduced through the anus and                            advanced to the the  terminal ileum, with                            identification of the appendiceal orifice and IC                            valve. The colonoscopy was performed without                            difficulty. The patient tolerated the procedure                            well. The quality of the bowel preparation was                            excellent. The terminal ileum, ileocecal valve,                            appendiceal orifice, and rectum were photographed. Scope In: 9:50:25 AM Scope Out: 9:59:26 AM Scope Withdrawal Time: 0 hours 6 minutes 12 seconds  Total Procedure Duration: 0 hours 9 minutes 1 second  Findings:                 Two external skin tags were present. Small external                            hemorrhoid present. A non-bleeding posterior anal                            fissure was found on perianal exam.                           The colon (entire examined portion) appeared normal.                           The terminal ileum appeared normal.                           Non-bleeding internal hemorrhoids were found. The  hemorrhoids were small.                           The exam was otherwise without abnormality on                            direct and retroflexion views. Complications:            No immediate complications. Estimated Blood Loss:     Estimated blood loss: none. Impression:               - Anal fissure found on perianal exam.                           - The entire examined colon is normal.                           - The examined portion of the ileum was normal.                           - Non-bleeding internal and external hemorrhoids.                           - The examination was otherwise normal on direct                            and retroflexion views.                           - No specimens collected. Recommendation:           - Patient has a contact number available for                            emergencies. The  signs and symptoms of potential                            delayed complications were discussed with the                            patient. Return to normal activities tomorrow.                            Written discharge instructions were provided to the                            patient.                           - Resume regular diet today. Daily psyllium                            recommended for stool bulking.                           - Continue present medications including Bentyl.                           -  Local therapy for fissure with additional                            bleeding.                           - Repeat colonoscopy for screening purposes at age                            46, or earlier with any new symptoms.                           - Plan virtual encounter to review these results. Tressia Danas MD, MD 06/30/2018 10:10:35 AM This report has been signed electronically.

## 2018-06-30 NOTE — Op Note (Signed)
[facility] Patient Name: Anna Roberts Procedure Date: 06/30/2018 9:36 AM MRN: [id number] Endoscopist: Tressia DanasKimberly Corinn Stoltzfus MD, MD Age: 18 Referring MD:  Date of Birth: 2000-09-19 Gender: Female Account #: [id number] Procedure:                Upper GI endoscopy Indications:              Generalized abdominal pain                           Painless rectal bleeding                           Internal hemorrhoids Medicines:                See the Anesthesia note for documentation of the                            administered medications Procedure:                Pre-Anesthesia Assessment:                           - Prior to the procedure, a History and Physical                            was performed, and patient medications and                            allergies were reviewed. The patient's tolerance of                            previous anesthesia was also reviewed. The risks                            and benefits of the procedure and the sedation                            options and risks were discussed with the patient.                            All questions were answered, and informed consent                            was obtained. Prior Anticoagulants: The patient has                            taken no previous anticoagulant or antiplatelet                            agents. ASA Grade Assessment: II - A patient with                            mild systemic disease. After reviewing the risks  and benefits, the patient was deemed in                            satisfactory condition to undergo the procedure.                           After obtaining informed consent, the endoscope was                            passed under direct vision. Throughout the                            procedure, the patient's blood pressure, pulse, and                            oxygen saturations were monitored continuously. The   Endoscope was introduced through the mouth, and                            advanced to the third part of duodenum. The upper                            GI endoscopy was accomplished without difficulty.                            The patient tolerated the procedure well. Scope In: Scope Out: Findings:                 The examined esophagus was normal.                           A few localized, medium non-bleeding erosions were                            found in the gastric antrum. There were no stigmata                            of recent bleeding. Biopsies were taken with a cold                            forceps for histology. Estimated blood loss was                            minimal.                           The examined duodenum was normal. Biopsies were                            taken with a cold forceps for histology. Estimated                            blood loss: none.  The cardia and gastric fundus were normal on                            retroflexion.                           The exam was otherwise without abnormality. Complications:            No immediate complications. Estimated blood loss:                            Minimal. Estimated Blood Loss:     Estimated blood loss was minimal. Impression:               - Normal esophagus.                           - Non-bleeding erosive gastropathy. Biopsied.                           - Normal examined duodenum. Biopsied.                           - The examination was otherwise normal. Recommendation:           - Patient has a contact number available for                            emergencies. The signs and symptoms of potential                            delayed complications were discussed with the                            patient. Return to normal activities tomorrow.                            Written discharge instructions were provided to the                            patient.                            - Resume regular diet today.                           - Continue present medications.                           - Avoid all NSAIDs including ibuprofen.                           - Await pathology results.                           - Pantoprazole 40 mg BID x 8 weeks.                           -  Repeat upper endoscopy is not recommended at this                            time.                           - Virtual encounter recommended to follow-up on                            these results when the pathology results are                            available. Tressia Danas MD, MD 06/30/2018 10:06:03 AM This report has been signed electronically.

## 2018-07-02 ENCOUNTER — Telehealth: Payer: Self-pay

## 2018-07-02 NOTE — Telephone Encounter (Signed)
  Follow up Call-  Call back number 06/30/2018  Post procedure Call Back phone  # [phone]  Permission to leave phone message Yes  Some recent data might be hidden     Patient questions:  Do you have a fever, pain , or abdominal swelling? No. Pain Score  0 *  Have you tolerated food without any problems? Yes.    Have you been able to return to your normal activities? Yes.    Do you have any questions about your discharge instructions: Diet   No. Medications  No. Follow up visit  No.  Do you have questions or concerns about your Care? No.  Actions: * If pain score is 4 or above: No action needed, pain <4.  1. Have you developed a fever since your procedure? No  2.   Have you had an respiratory symptoms (SOB or cough) since your procedure? No  3.   Have you tested positive for COVID 19 since your procedure No  4.   Have you had any family members/close contacts diagnosed with the COVID 19 since your procedure?  No   If yes to any of these questions please route to Laverna Peace, RN and Jennye Boroughs, RN.

## 2018-07-06 ENCOUNTER — Encounter: Payer: Self-pay | Admitting: Gastroenterology

## 2018-12-03 DIAGNOSIS — Z23 Encounter for immunization: Secondary | ICD-10-CM | POA: Diagnosis not present

## 2019-02-02 ENCOUNTER — Ambulatory Visit: Payer: Medicaid Other | Attending: Internal Medicine

## 2019-02-02 DIAGNOSIS — U071 COVID-19: Secondary | ICD-10-CM

## 2019-02-02 DIAGNOSIS — R238 Other skin changes: Secondary | ICD-10-CM

## 2019-02-04 LAB — NOVEL CORONAVIRUS, NAA: SARS-CoV-2, NAA: NOT DETECTED

## 2019-04-01 DIAGNOSIS — Z6838 Body mass index (BMI) 38.0-38.9, adult: Secondary | ICD-10-CM | POA: Diagnosis not present

## 2019-04-01 DIAGNOSIS — J4530 Mild persistent asthma, uncomplicated: Secondary | ICD-10-CM | POA: Diagnosis not present

## 2019-04-27 DIAGNOSIS — K259 Gastric ulcer, unspecified as acute or chronic, without hemorrhage or perforation: Secondary | ICD-10-CM | POA: Diagnosis not present

## 2019-04-27 DIAGNOSIS — F3181 Bipolar II disorder: Secondary | ICD-10-CM | POA: Diagnosis not present

## 2019-04-27 DIAGNOSIS — L853 Xerosis cutis: Secondary | ICD-10-CM | POA: Diagnosis not present

## 2019-04-27 DIAGNOSIS — Z309 Encounter for contraceptive management, unspecified: Secondary | ICD-10-CM | POA: Diagnosis not present

## 2019-04-27 DIAGNOSIS — R197 Diarrhea, unspecified: Secondary | ICD-10-CM | POA: Diagnosis not present

## 2019-05-19 DIAGNOSIS — Z124 Encounter for screening for malignant neoplasm of cervix: Secondary | ICD-10-CM | POA: Diagnosis not present

## 2019-05-19 DIAGNOSIS — Z309 Encounter for contraceptive management, unspecified: Secondary | ICD-10-CM | POA: Diagnosis not present

## 2019-05-22 DIAGNOSIS — S59911A Unspecified injury of right forearm, initial encounter: Secondary | ICD-10-CM | POA: Diagnosis not present

## 2019-05-22 DIAGNOSIS — S4991XA Unspecified injury of right shoulder and upper arm, initial encounter: Secondary | ICD-10-CM | POA: Diagnosis not present

## 2019-05-22 DIAGNOSIS — S52134A Nondisplaced fracture of neck of right radius, initial encounter for closed fracture: Secondary | ICD-10-CM | POA: Diagnosis not present

## 2019-05-22 DIAGNOSIS — M25421 Effusion, right elbow: Secondary | ICD-10-CM | POA: Diagnosis not present

## 2019-05-29 DIAGNOSIS — M79601 Pain in right arm: Secondary | ICD-10-CM | POA: Diagnosis not present

## 2019-06-04 DIAGNOSIS — M25521 Pain in right elbow: Secondary | ICD-10-CM | POA: Diagnosis not present

## 2019-10-28 DIAGNOSIS — F901 Attention-deficit hyperactivity disorder, predominantly hyperactive type: Secondary | ICD-10-CM | POA: Diagnosis not present

## 2019-10-28 DIAGNOSIS — F411 Generalized anxiety disorder: Secondary | ICD-10-CM | POA: Diagnosis not present

## 2019-10-28 DIAGNOSIS — G4700 Insomnia, unspecified: Secondary | ICD-10-CM | POA: Diagnosis not present

## 2019-10-28 DIAGNOSIS — F3164 Bipolar disorder, current episode mixed, severe, with psychotic features: Secondary | ICD-10-CM | POA: Diagnosis not present

## 2019-10-28 DIAGNOSIS — Z79899 Other long term (current) drug therapy: Secondary | ICD-10-CM | POA: Diagnosis not present

## 2019-10-28 DIAGNOSIS — F410 Panic disorder [episodic paroxysmal anxiety] without agoraphobia: Secondary | ICD-10-CM | POA: Diagnosis not present

## 2020-06-11 ENCOUNTER — Emergency Department (HOSPITAL_COMMUNITY)
Admission: EM | Admit: 2020-06-11 | Discharge: 2020-06-11 | Disposition: A | Discharge status: Home / Self Care | Payer: Medicaid Other | Attending: Emergency Medicine | Admitting: Emergency Medicine

## 2020-06-11 ENCOUNTER — Other Ambulatory Visit: Payer: Self-pay

## 2020-06-11 ENCOUNTER — Encounter (HOSPITAL_COMMUNITY): Payer: Self-pay

## 2020-06-11 ENCOUNTER — Emergency Department (HOSPITAL_COMMUNITY): Payer: Medicaid Other

## 2020-06-11 DIAGNOSIS — R110 Nausea: Secondary | ICD-10-CM | POA: Diagnosis not present

## 2020-06-11 DIAGNOSIS — Z7722 Contact with and (suspected) exposure to environmental tobacco smoke (acute) (chronic): Secondary | ICD-10-CM | POA: Diagnosis not present

## 2020-06-11 DIAGNOSIS — J45909 Unspecified asthma, uncomplicated: Secondary | ICD-10-CM | POA: Insufficient documentation

## 2020-06-11 DIAGNOSIS — R1013 Epigastric pain: Secondary | ICD-10-CM | POA: Diagnosis not present

## 2020-06-11 LAB — CBC WITH DIFFERENTIAL/PLATELET
Abs Immature Granulocytes: 0.02 10*3/uL (ref 0.00–0.07)
Basophils Absolute: 0.1 10*3/uL (ref 0.0–0.1)
Basophils Relative: 1 %
Eosinophils Absolute: 0.1 10*3/uL (ref 0.0–0.5)
Eosinophils Relative: 2 %
HCT: 42.6 % (ref 36.0–46.0)
Hemoglobin: 14 g/dL (ref 12.0–15.0)
Immature Granulocytes: 0 %
Lymphocytes Relative: 36 %
Lymphs Abs: 2.4 10*3/uL (ref 0.7–4.0)
MCH: 30.6 pg (ref 26.0–34.0)
MCHC: 32.9 g/dL (ref 30.0–36.0)
MCV: 93 fL (ref 80.0–100.0)
Monocytes Absolute: 0.5 10*3/uL (ref 0.1–1.0)
Monocytes Relative: 7 %
Neutro Abs: 3.6 10*3/uL (ref 1.7–7.7)
Neutrophils Relative %: 54 %
Platelets: 343 10*3/uL (ref 150–400)
RBC: 4.58 MIL/uL (ref 3.87–5.11)
RDW: 12.1 % (ref 11.5–15.5)
WBC: 6.7 10*3/uL (ref 4.0–10.5)
nRBC: 0 % (ref 0.0–0.2)

## 2020-06-11 LAB — COMPREHENSIVE METABOLIC PANEL
ALT: 25 U/L (ref 0–44)
AST: 26 U/L (ref 15–41)
Albumin: 4.3 g/dL (ref 3.5–5.0)
Alkaline Phosphatase: 68 U/L (ref 38–126)
Anion gap: 8 (ref 5–15)
BUN: 12 mg/dL (ref 6–20)
CO2: 24 mmol/L (ref 22–32)
Calcium: 9.6 mg/dL (ref 8.9–10.3)
Chloride: 107 mmol/L (ref 98–111)
Creatinine, Ser: 0.91 mg/dL (ref 0.44–1.00)
GFR, Estimated: >60 mL/min (ref >60)
Glucose, Bld: 81 mg/dL (ref 70–99)
Potassium: 3.9 mmol/L (ref 3.5–5.1)
Sodium: 139 mmol/L (ref 135–145)
Total Bilirubin: 0.9 mg/dL (ref 0.3–1.2)
Total Protein: 7.9 g/dL (ref 6.5–8.1)

## 2020-06-11 LAB — URINALYSIS, ROUTINE W REFLEX MICROSCOPIC
Bilirubin Urine: NEGATIVE
Glucose, UA: NEGATIVE mg/dL
Hgb urine dipstick: NEGATIVE
Ketones, ur: NEGATIVE mg/dL
Leukocytes,Ua: NEGATIVE
Nitrite: NEGATIVE
Protein, ur: NEGATIVE mg/dL
Specific Gravity, Urine: 1.023 (ref 1.005–1.030)
pH: 5 (ref 5.0–8.0)

## 2020-06-11 LAB — I-STAT BETA HCG BLOOD, ED (MC, WL, AP ONLY): I-stat hCG, quantitative: <5 m[IU]/mL (ref <5)

## 2020-06-11 LAB — LIPASE, BLOOD: Lipase: 27 U/L (ref 11–51)

## 2020-06-11 MED ORDER — SODIUM CHLORIDE 0.9 % IV BOLUS
1000.0000 mL | Freq: Once | INTRAVENOUS | Status: AC
  Administered 2020-06-11: 1000 mL via INTRAVENOUS

## 2020-06-11 MED ORDER — ONDANSETRON HCL 4 MG/2ML IJ SOLN
4.0000 mg | Freq: Once | INTRAMUSCULAR | Status: AC
  Administered 2020-06-11: 4 mg via INTRAVENOUS
  Filled 2020-06-11: qty 2

## 2020-06-11 MED ORDER — ONDANSETRON 4 MG PO TBDP: 4.0000 mg | ORAL_TABLET | Freq: Three times a day (TID) | ORAL | 0 refills | Status: DC | PRN

## 2020-06-11 MED ORDER — PANTOPRAZOLE SODIUM 20 MG PO TBEC: 20.0000 mg | DELAYED_RELEASE_TABLET | Freq: Two times a day (BID) | ORAL | 1 refills | Status: DC

## 2020-06-11 MED ORDER — FAMOTIDINE 20 MG PO TABS: 20.0000 mg | ORAL_TABLET | Freq: Two times a day (BID) | ORAL | 0 refills | Status: DC

## 2020-06-11 MED ORDER — LIDOCAINE VISCOUS HCL 2 % MT SOLN
15.0000 mL | Freq: Once | OROMUCOSAL | Status: AC
  Administered 2020-06-11: 15 mL via ORAL
  Filled 2020-06-11: qty 15

## 2020-06-11 MED ORDER — FAMOTIDINE IN NACL 20-0.9 MG/50ML-% IV SOLN
20.0000 mg | Freq: Once | INTRAVENOUS | Status: AC
  Administered 2020-06-11: 20 mg via INTRAVENOUS
  Filled 2020-06-11: qty 50

## 2020-06-11 MED ORDER — PANTOPRAZOLE SODIUM 40 MG IV SOLR
40.0000 mg | Freq: Once | INTRAVENOUS | Status: AC
  Administered 2020-06-11: 40 mg via INTRAVENOUS
  Filled 2020-06-11: qty 40

## 2020-06-11 MED ORDER — ALUM & MAG HYDROXIDE-SIMETH 200-200-20 MG/5ML PO SUSP
30.0000 mL | Freq: Once | ORAL | Status: AC
  Administered 2020-06-11: 30 mL via ORAL
  Filled 2020-06-11: qty 30

## 2020-06-11 MED ORDER — SUCRALFATE 1 G PO TABS: 1.0000 g | ORAL_TABLET | Freq: Three times a day (TID) | ORAL | 0 refills | Status: DC

## 2020-06-11 NOTE — ED Triage Notes (Signed)
Pt reports she was dx with stomach ulcers last year and feels the pain is worse since being dx. Pt denies eating anything inaproriate, she feels this incident is due to stress.

## 2020-06-11 NOTE — ED Provider Notes (Signed)
Emergency Medicine Provider Triage Evaluation Note  Anna Roberts , a 20 y.o. female  was evaluated in triage.  Pt complains of abdominal pain,  Pt reports she thinks she has ulcers  Review of Systems  Positive: no fever Negative: no cough   Physical Exam  BP 126/75 (BP Location: Left Arm)   Pulse 76   Temp 97.7 F (36.5 C)   Resp 16   SpO2 98%  Gen:   Awake, no distress   Resp:  Normal effort  MSK:   Moves extremities without difficulty  Other:    Medical Decision Making  Medically screening exam initiated at 11:42 AM.  Appropriate orders placed.  Anna Roberts was informed that the remainder of the evaluation will be completed by another provider, this initial triage assessment does not replace that evaluation, and the importance of remaining in the ED until their evaluation is complete.     Anna Roberts, [place] 06/11/20 1144    Tegeler, Canary Brim, MD 06/11/20 2032

## 2020-06-11 NOTE — ED Provider Notes (Signed)
[facility] EMERGENCY DEPARTMENT Provider Note   CSN: [id number] Arrival date & time: 06/11/20  1129     History Chief Complaint  Patient presents with  . Abdominal Pain    RASHEEDAH REIS is a 20 y.o. female.  HPI     SHELLYE ZANDI is a 20 y.o. female, with a history of asthma, gastritis, presenting to the ED with abdominal pain beginning this morning upon waking. Pain is sharp, epigastric, radiating towards the left upper quadrant, 10/10, constant.  She had been experiencing similar pain intermittently for the past month, but only this morning did it become constant.  Dark stools for the last month. Patient states she experienced similar symptoms previously and was subsequently diagnosed with gastric ulcer.  She finished her course of pantoprazole at that time, but when the prescription ran out she did not continue taking it. Pain is accompanied by nausea.  Last food was yesterday.  Denies fever/chills, chest pain, shortness of breath, other abdominal pain, urinary symptoms, hematochezia, flank/back pain, syncope, vomiting, or any other complaints.   Past Medical History:  Diagnosis Date  . Allergy   . Asthma     Patient Active Problem List   Diagnosis Date Noted  . Internal hemorrhoid 06/05/2018  . Abdominal pain, periumbilical 06/05/2018  . STREP THROAT 04/03/2009  . SORE THROAT 04/03/2009  . OBESITY, UNSPECIFIED 05/26/2008  . ALLERGIC RHINITIS 05/26/2008    Past Surgical History:  Procedure Laterality Date  . KNEE DISLOCATION SURGERY       OB History   No obstetric history on file.     Family History  Problem Relation Age of Onset  . Diabetes Maternal Aunt   . Colon cancer Neg Hx   . Esophageal cancer Neg Hx   . Rectal cancer Neg Hx   . Stomach cancer Neg Hx     Social History   Tobacco Use  . Smoking status: Passive Smoke Exposure - Never Smoker  . Smokeless tobacco: Never Used  Vaping Use  . Vaping Use: Never used  Substance  Use Topics  . Alcohol use: Never  . Drug use: Never    Home Medications Prior to Admission medications   Medication Sig Start Date End Date Taking? Authorizing Provider  famotidine (PEPCID) 20 MG tablet Take 1 tablet (20 mg total) by mouth 2 (two) times daily for 5 days. 06/11/20 06/16/20 Yes Sharbel Sahagun C, PA-C  ondansetron (ZOFRAN ODT) 4 MG disintegrating tablet Take 1 tablet (4 mg total) by mouth every 8 (eight) hours as needed for nausea or vomiting. 06/11/20  Yes Torben Soloway C, PA-C  pantoprazole (PROTONIX) 20 MG tablet Take 1 tablet (20 mg total) by mouth 2 (two) times daily. 06/11/20 08/10/20 Yes Kelley Polinsky C, PA-C  sucralfate (CARAFATE) 1 g tablet Take 1 tablet (1 g total) by mouth 4 (four) times daily -  with meals and at bedtime. 06/11/20 07/11/20 Yes Jahson Emanuele C, PA-C  dicyclomine (BENTYL) 20 MG tablet Take 1 tablet (20 mg total) by mouth 4 (four) times daily -  before meals and at bedtime. 06/08/18   Tressia Danas, MD  diphenhydrAMINE (BENADRYL) 25 MG tablet Take 50 mg by mouth every 6 (six) hours as needed for itching or allergies (cats).    [provider]  etonogestrel (NEXPLANON) 68 MG IMPL implant 1 each by Subdermal route once.    [provider]  hydrocortisone (ANUSOL-HC) 2.5 % rectal cream Place 1 application rectally 2 (two) times daily. Provide  applicator for internal hemorrhoids. Patient not taking: Reported on 06/08/2018 06/05/18   Excell SeltzerBedsole, Amy E, MD  ibuprofen (ADVIL) 200 MG tablet Take 400 mg by mouth every 6 (six) hours as needed for headache or moderate pain.    [provider]  loratadine (CLARITIN) 10 MG tablet Take 1 tablet by mouth daily.    [provider]  montelukast (SINGULAIR) 10 MG tablet Take 1 tablet by mouth daily. 05/24/18   [provider]  PROAIR HFA 108 (90 Base) MCG/ACT inhaler Inhale 2 puffs into the lungs every 4 (four) hours as needed for wheezing or shortness of breath. 04/02/18   [provider]   SYMBICORT 160-4.5 MCG/ACT inhaler Inhale 2 puffs into the lungs 2 (two) times daily. 04/30/18   [provider]    Allergies    Triamcinolone acetonide  Review of Systems   Review of Systems  Constitutional: Negative for chills, diaphoresis and fever.  Respiratory: Negative for shortness of breath.   Cardiovascular: Negative for chest pain.  Gastrointestinal: Positive for abdominal pain and nausea. Negative for vomiting.  Genitourinary: Negative for difficulty urinating, dysuria and flank pain.  Musculoskeletal: Negative for back pain.  Neurological: Negative for dizziness, syncope and weakness.  All other systems reviewed and are negative.   Physical Exam Updated Vital Signs BP 126/75 (BP Location: Left Arm)   Pulse 76   Temp 97.7 F (36.5 C)   Resp 16   SpO2 98%   Physical Exam Vitals and nursing note reviewed.  Constitutional:      General: She is not in acute distress.    Appearance: She is well-developed. She is obese. She is not diaphoretic.  HENT:     Head: Normocephalic and atraumatic.     Mouth/Throat:     Mouth: Mucous membranes are moist.     Pharynx: Oropharynx is clear.  Eyes:     Conjunctiva/sclera: Conjunctivae normal.  Cardiovascular:     Rate and Rhythm: Normal rate and regular rhythm.     Pulses: Normal pulses.     Heart sounds: Normal heart sounds.  Pulmonary:     Effort: Pulmonary effort is normal. No respiratory distress.     Breath sounds: Normal breath sounds.  Abdominal:     Palpations: Abdomen is soft.     Tenderness: There is abdominal tenderness. There is no guarding.    Musculoskeletal:     Cervical back: Neck supple.  Lymphadenopathy:     Cervical: No cervical adenopathy.  Skin:    General: Skin is warm and dry.  Neurological:     Mental Status: She is alert.  Psychiatric:        Mood and Affect: Mood and affect normal.        Speech: Speech normal.        Behavior: Behavior normal.     ED Results / Procedures /  Treatments   Labs (all labs ordered are listed, but only abnormal results are displayed) Labs Reviewed  URINALYSIS, ROUTINE W REFLEX MICROSCOPIC - Abnormal; Notable for the following components:      Result Value   Bacteria, UA RARE (*)    All other components within normal limits  CBC WITH DIFFERENTIAL/PLATELET  COMPREHENSIVE METABOLIC PANEL  LIPASE, BLOOD  I-STAT BETA HCG BLOOD, ED ([facility], [facility], [facility] ONLY)  POC OCCULT BLOOD, ED    EKG None  Radiology CT ABDOMEN PELVIS WO CONTRAST  Result Date: 06/11/2020 CLINICAL DATA:  Abdominal pain, history of stomach ulcers. EXAM: CT ABDOMEN  AND PELVIS WITHOUT CONTRAST TECHNIQUE: Multidetector CT imaging of the abdomen and pelvis was performed following the standard protocol without IV contrast. COMPARISON:  None. FINDINGS: Lower chest: No acute abnormality. Evaluation of the abdominal viscera limited by the lack of IV contrast. Hepatobiliary: No focal liver abnormality is seen. Normal gallbladder. Pancreas: Unremarkable. No surrounding inflammatory changes. Spleen: Normal in size without focal abnormality. Adrenals/Urinary Tract: Adrenal glands are unremarkable. Kidneys are normal, without renal calculi, focal lesion, or hydronephrosis. Bladder is unremarkable. Stomach/Bowel: Stomach is within normal limits. Appendix appears normal. No evidence of bowel wall thickening, distention, or inflammatory changes. Vascular/Lymphatic: Vascular patency cannot be assessed in the absence IV contrast. No enlarged abdominal or pelvic lymph nodes. Reproductive: Uterus and bilateral adnexa are unremarkable. Other: No abdominal wall hernia or abnormality. No abdominopelvic ascites. Musculoskeletal: No acute or significant osseous findings. IMPRESSION: No acute findings in the abdomen or pelvis on a noncontrast exam. Electronically Signed   By: Emmaline Kluver M.D.   On: 06/11/2020 19:46    Procedures Procedures   Medications Ordered in ED Medications  sodium chloride  0.9 % bolus 1,000 mL (0 mLs Intravenous Stopped 06/11/20 1946)  ondansetron (ZOFRAN) injection 4 mg (4 mg Intravenous Given 06/11/20 1758)  famotidine (PEPCID) IVPB 20 mg premix (0 mg Intravenous Stopped 06/11/20 1913)  pantoprazole (PROTONIX) injection 40 mg (40 mg Intravenous Given 06/11/20 1759)  alum & mag hydroxide-simeth (MAALOX/MYLANTA) 200-200-20 MG/5ML suspension 30 mL (30 mLs Oral Given 06/11/20 1927)    And  lidocaine (XYLOCAINE) 2 % viscous mouth solution 15 mL (15 mLs Oral Given 06/11/20 1927)    ED Course  I have reviewed the triage vital signs and the nursing notes.  Pertinent labs & imaging results that were available during my care of the patient were reviewed by me and considered in my medical decision making (see chart for details).  Clinical Course as of 06/11/20 2046  Wynelle Link Jun 11, 2020  2029 Patient's pain has resolved.  [SJ]    Clinical Course User Index [SJ] Chauntel Windsor, Hillard Danker, PA-C   MDM Rules/Calculators/A&P                          Patient presents with complaint of upper abdominal pain and nausea. Patient is nontoxic appearing, afebrile, not tachycardic, not tachypneic, not hypotensive, maintains excellent SPO2 on room air.   I have reviewed the patient's chart to obtain more information.   I reviewed and interpreted the patient's labs and radiological studies.  Discussed and recommended Hemoccult with explanation, patient declined. No leukocytosis.  Lab work overall reassuring. CT without acute abnormality, specifically no evidence of perforation.  Unremarkable gallbladder. Resolution in the patient's complaints with treatment here in the ED.  Tolerating oral fluids prior to discharge.  The patient was given instructions for home care as well as return precautions. Patient voices understanding of these instructions, accepts the plan, and is comfortable with discharge.    This patient was evaluated during a time of global shortage of iodinated contrast media.  Based on guidance from the [employer] of Radiology, best practices, and local institutional approaches an alternative path for evaluating and managing the patient may have been employed in order to provide optimal care during this shortage. The current situation has been discussed with the patient.  Vitals:   06/11/20 1623 06/11/20 1730 06/11/20 1745 06/11/20 1930  BP: 117/80 124/87 115/78 129/79  Pulse: (!) 56 64 71 72  Resp: 16 16 16  16  Temp:      SpO2: 99% 100% 100% 100%    Final Clinical Impression(s) / ED Diagnoses Final diagnoses:  Epigastric pain    Rx / DC Orders ED Discharge Orders         Ordered    ondansetron (ZOFRAN ODT) 4 MG disintegrating tablet  Every 8 hours PRN        06/11/20 2023    pantoprazole (PROTONIX) 20 MG tablet  2 times daily        06/11/20 2023    famotidine (PEPCID) 20 MG tablet  2 times daily        06/11/20 2023    sucralfate (CARAFATE) 1 g tablet  3 times daily with meals & bedtime        06/11/20 2023           Anselm Pancoast, PA-C 06/11/20 2048    Tegeler, Canary Brim, MD 06/14/20 1515

## 2020-06-11 NOTE — Discharge Instructions (Addendum)
  Diet: Start with a clear liquid diet, progressed to a full liquid diet, and then bland solids as you are able. Please adhere to the enclosed dietary suggestions.  In general, avoid NSAIDs (i.e. ibuprofen, naproxen, etc.), caffeine, alcohol, spicy foods, fatty foods, or any other foods that seem to cause your symptoms to arise.  Protonix: Take this medication daily, 20-30 minutes prior to your first meal, for the next 8 weeks.  Continue to take this medication even if you begin to feel better.  Pepcid: Take this medication twice a day for the next 5 days.  Sucralfate: Sucralfate (generic for Carafate) is meant to soothe symptoms of abdominal discomfort and reflux.  Nausea/vomiting: Use the ondansetron (generic for Zofran) for nausea or vomiting.  This medication may not prevent all vomiting or nausea, but can help facilitate better hydration. Things that can help with nausea/vomiting also include peppermint/menthol candies, vitamin B12, and ginger.  Follow-up: Please follow-up with your primary care provider on this matter.  Return: Return to the ED for significantly worsening symptoms, persistent vomiting, persistent fever, vomiting blood, blood in the stools, dark stools, or any other major concerns.  For prescription assistance, may try using prescription discount sites or apps, such as goodrx.com 

## 2020-06-11 NOTE — ED Notes (Signed)
DC instructions reviewed with pt. Pt verbalized understanding.  PT DC.  

## 2020-07-05 ENCOUNTER — Ambulatory Visit: Payer: Medicaid Other | Admitting: Physician Assistant

## 2021-01-03 ENCOUNTER — Emergency Department (HOSPITAL_COMMUNITY)
Admission: EM | Admit: 2021-01-03 | Discharge: 2021-01-04 | Disposition: A | Discharge status: Home / Self Care | Payer: Medicaid Other | Attending: Emergency Medicine | Admitting: Emergency Medicine

## 2021-01-03 ENCOUNTER — Telehealth: Payer: Medicaid Other | Admitting: Emergency Medicine

## 2021-01-03 ENCOUNTER — Other Ambulatory Visit: Payer: Self-pay

## 2021-01-03 ENCOUNTER — Encounter (HOSPITAL_COMMUNITY): Payer: Self-pay | Admitting: Emergency Medicine

## 2021-01-03 ENCOUNTER — Emergency Department (HOSPITAL_COMMUNITY): Payer: Medicaid Other

## 2021-01-03 DIAGNOSIS — R519 Headache, unspecified: Secondary | ICD-10-CM | POA: Diagnosis present

## 2021-01-03 DIAGNOSIS — J45909 Unspecified asthma, uncomplicated: Secondary | ICD-10-CM | POA: Insufficient documentation

## 2021-01-03 DIAGNOSIS — J0110 Acute frontal sinusitis, unspecified: Secondary | ICD-10-CM | POA: Insufficient documentation

## 2021-01-03 DIAGNOSIS — Z20822 Contact with and (suspected) exposure to covid-19: Secondary | ICD-10-CM | POA: Diagnosis not present

## 2021-01-03 DIAGNOSIS — G43909 Migraine, unspecified, not intractable, without status migrainosus: Secondary | ICD-10-CM | POA: Insufficient documentation

## 2021-01-03 LAB — RESP PANEL BY RT-PCR (FLU A&B, COVID) ARPGX2
Influenza A by PCR: NEGATIVE
Influenza B by PCR: NEGATIVE
SARS Coronavirus 2 by RT PCR: NEGATIVE

## 2021-01-03 LAB — PREGNANCY, URINE: Preg Test, Ur: NEGATIVE

## 2021-01-03 MED ORDER — SODIUM CHLORIDE 0.9 % IV BOLUS
1000.0000 mL | Freq: Once | INTRAVENOUS | Status: AC
  Administered 2021-01-03: 1000 mL via INTRAVENOUS

## 2021-01-03 MED ORDER — KETOROLAC TROMETHAMINE 15 MG/ML IJ SOLN
15.0000 mg | Freq: Once | INTRAMUSCULAR | Status: AC
  Administered 2021-01-03: 15 mg via INTRAVENOUS
  Filled 2021-01-03: qty 1

## 2021-01-03 MED ORDER — MAGNESIUM SULFATE 2 GM/50ML IV SOLN
2.0000 g | INTRAVENOUS | Status: AC
  Administered 2021-01-03: 2 g via INTRAVENOUS
  Filled 2021-01-03: qty 50

## 2021-01-03 MED ORDER — DEXAMETHASONE 4 MG PO TABS
10.0000 mg | ORAL_TABLET | Freq: Once | ORAL | Status: AC
  Administered 2021-01-03: 10 mg via ORAL
  Filled 2021-01-03: qty 3

## 2021-01-03 MED ORDER — METOCLOPRAMIDE HCL 5 MG/ML IJ SOLN
10.0000 mg | INTRAMUSCULAR | Status: AC
  Administered 2021-01-03: 10 mg via INTRAVENOUS
  Filled 2021-01-03: qty 2

## 2021-01-03 NOTE — ED Provider Notes (Signed)
Emergency Medicine Provider Triage Evaluation Note  Anna Roberts , a 20 y.o. female  was evaluated in triage.  Pt complains of gradual onset, constant, sharp, left-sided headache that began 3 days ago.  She also complains of photophobia and nausea.  She states she has seen flashes of light as well.  She states that she typically gets headaches however they do not last this long and do not typically have other symptoms with it.  She has been taking Tylenol without relief.  She states that she cannot take NSAIDs due to history of ulcers.  She also mentions that she has had a cough recently however does not think it is related.  She denies any fevers, chills, neck stiffness, rash, unilateral weakness or numbness, body aches, sore throat.  Review of Systems  Positive: + headache, nausea, photophobia, cough Negative: - vomiting, confusion, neck stiffness, rash, fever  Physical Exam  BP (!) 134/98   Pulse 80   Temp 98.7 F (37.1 C) (Oral)   Resp 18   SpO2 100%  Gen:   Awake, no distress   Resp:  Normal effort  MSK:   Moves extremities without difficulty  Other:  CN 2-12 intact. Strength 5/5 to Bue and BLEs. No pronator drift. No facial droop.   Medical Decision Making  Medically screening exam initiated at 3:13 PM.  Appropriate orders placed.  [name] was informed that the remainder of the evaluation will be completed by another provider, this initial triage assessment does not replace that evaluation, and the importance of remaining in the ED until their evaluation is complete.     Tanda Rockers, PA-C 01/03/21 1514    Sloan Leiter, DO 01/03/21 1642

## 2021-01-03 NOTE — Progress Notes (Signed)
Virtual Visit Consent   Anna Roberts, you are scheduled for a virtual visit with a [facility] provider today.     Just as with appointments in the office, your consent must be obtained to participate.  Your consent will be active for this visit and any virtual visit you may have with one of our providers in the next 365 days.     If you have a MyChart account, a copy of this consent can be sent to you electronically.  All virtual visits are billed to your insurance company just like a traditional visit in the office.    As this is a virtual visit, video technology does not allow for your provider to perform a traditional examination.  This may limit your provider's ability to fully assess your condition.  If your provider identifies any concerns that need to be evaluated in person or the need to arrange testing (such as labs, EKG, etc.), we will make arrangements to do so.     Although advances in technology are sophisticated, we cannot ensure that it will always work on either your end or our end.  If the connection with a video visit is poor, the visit may have to be switched to a telephone visit.  With either a video or telephone visit, we are not always able to ensure that we have a secure connection.     I need to obtain your verbal consent now.   Are you willing to proceed with your visit today?    YEILIN Roberts has provided verbal consent on 01/03/2021 for a virtual visit video.   Anna Horseman, PA-C   Date: 01/03/2021 1:14 PM   Virtual Visit via Video Note   I, Anna Roberts, connected with  Anna Roberts  ([id number], 23-Mar-2000) on 01/03/21 at  1:15 PM EST by a video-enabled telemedicine application and verified that I am speaking with the correct person using two identifiers.  Location: Patient: Virtual Visit Location Patient: Home Provider: Virtual Visit Location Provider: Home Office   I discussed the limitations of evaluation and management by telemedicine and the  availability of in person appointments. The patient expressed understanding and agreed to proceed.    History of Present Illness: Anna Roberts is a 20 y.o. who identifies as a female who was assigned female at birth, and is being seen today for headache.  Has been having headaches for a few days.  States that it is waking her up in the night.  Denies ever having had a headache like this before.  Denies any fever.  Reports blurry vision and light sensitivity.  Reports seeing flashing lights in her vision at night.  HPI: HPI  Problems:  Patient Active Problem List   Diagnosis Date Noted   Internal hemorrhoid 06/05/2018   Abdominal pain, periumbilical 06/05/2018   STREP THROAT 04/03/2009   SORE THROAT 04/03/2009   OBESITY, UNSPECIFIED 05/26/2008   ALLERGIC RHINITIS 05/26/2008    Allergies:  Allergies  Allergen Reactions   Triamcinolone Acetonide Other (See Comments)    Skin irritation, burning, made skin worse    Medications:  Current Outpatient Medications:    dicyclomine (BENTYL) 20 MG tablet, Take 1 tablet (20 mg total) by mouth 4 (four) times daily -  before meals and at bedtime., Disp: 120 tablet, Rfl: 3   diphenhydrAMINE (BENADRYL) 25 MG tablet, Take 50 mg by mouth every 6 (six) hours as needed for itching or allergies (cats)., Disp: , Rfl:  etonogestrel (NEXPLANON) 68 MG IMPL implant, 1 each by Subdermal route once., Disp: , Rfl:    famotidine (PEPCID) 20 MG tablet, Take 1 tablet (20 mg total) by mouth 2 (two) times daily for 5 days., Disp: 10 tablet, Rfl: 0   hydrocortisone (ANUSOL-HC) 2.5 % rectal cream, Place 1 application rectally 2 (two) times daily. Provide applicator for internal hemorrhoids. (Patient not taking: Reported on 06/08/2018), Disp: 30 g, Rfl: 0   ibuprofen (ADVIL) 200 MG tablet, Take 400 mg by mouth every 6 (six) hours as needed for headache or moderate pain., Disp: , Rfl:    loratadine (CLARITIN) 10 MG tablet, Take 1 tablet by mouth daily., Disp: , Rfl:     montelukast (SINGULAIR) 10 MG tablet, Take 1 tablet by mouth daily., Disp: , Rfl:    ondansetron (ZOFRAN ODT) 4 MG disintegrating tablet, Take 1 tablet (4 mg total) by mouth every 8 (eight) hours as needed for nausea or vomiting., Disp: 20 tablet, Rfl: 0   pantoprazole (PROTONIX) 20 MG tablet, Take 1 tablet (20 mg total) by mouth 2 (two) times daily., Disp: 60 tablet, Rfl: 1   PROAIR HFA 108 (90 Base) MCG/ACT inhaler, Inhale 2 puffs into the lungs every 4 (four) hours as needed for wheezing or shortness of breath., Disp: , Rfl:    sucralfate (CARAFATE) 1 g tablet, Take 1 tablet (1 g total) by mouth 4 (four) times daily -  with meals and at bedtime., Disp: 120 tablet, Rfl: 0   SYMBICORT 160-4.5 MCG/ACT inhaler, Inhale 2 puffs into the lungs 2 (two) times daily., Disp: , Rfl:   Observations/Objective: Patient is well-developed, well-nourished in no acute distress.  Resting comfortably at home.  Head is normocephalic, atraumatic.  No labored breathing.  Speech is clear and coherent with logical content.  Patient is alert and oriented at baseline.    Assessment and Plan: 1. Acute intractable headache, unspecified headache type Redirected to ED.  Symptoms could be simple migraine or ocular migraine, but with persistent headaches and vision changes, there is concern for idiopathic intracranial hypertension.  Feel in person exam is required to look for papilledema and/or consider advanced imaging.  Follow Up Instructions: I discussed the assessment and treatment plan with the patient. The patient was provided an opportunity to ask questions and all were answered. The patient agreed with the plan and demonstrated an understanding of the instructions.  A copy of instructions were sent to the patient via MyChart unless otherwise noted below.     The patient was advised to call back or seek an in-person evaluation if the symptoms worsen or if the condition fails to improve as anticipated.  Time:  I  spent 11 minutes with the patient via telehealth technology discussing the above problems/concerns.    Anna Horseman, PA-C

## 2021-01-03 NOTE — ED Triage Notes (Signed)
Pt c/o migraine x 3 days with sensitivity to light and nausea.

## 2021-01-03 NOTE — ED Provider Notes (Signed)
[facility] EMERGENCY DEPARTMENT Provider Note   CSN: [id number] Arrival date & time: 01/03/21  1341     History Chief Complaint  Patient presents with   Migraine    Anna Roberts is a 20 y.o. female.  20 year old female presents to the emergency department for evaluation of a RIGHT sided headache which has been constant x 3 days.  Pain is sharp and has been unrelieved with Tylenol.  She reports inability to take NSAIDs secondary to ulcers.  Complains of associated photophobia as well as nausea, intermittent blurry vision.  She has not had any extremity numbness or paresthesias, extremity weakness, fevers, recent head injury or trauma, vomiting, neck stiffness, body aches, sore throat.  She has had headaches in the past, but they usually relieved with over-the-counter analgesics.  The history is provided by the patient. No language interpreter was used.  Migraine      Past Medical History:  Diagnosis Date   Allergy    Asthma     Patient Active Problem List   Diagnosis Date Noted   Internal hemorrhoid 06/05/2018   Abdominal pain, periumbilical 06/05/2018   STREP THROAT 04/03/2009   SORE THROAT 04/03/2009   OBESITY, UNSPECIFIED 05/26/2008   ALLERGIC RHINITIS 05/26/2008    Past Surgical History:  Procedure Laterality Date   KNEE DISLOCATION SURGERY       OB History   No obstetric history on file.     Family History  Problem Relation Age of Onset   Diabetes Maternal Aunt    Colon cancer Neg Hx    Esophageal cancer Neg Hx    Rectal cancer Neg Hx    Stomach cancer Neg Hx     Social History   Tobacco Use   Smoking status: Passive Smoke Exposure - Never Smoker   Smokeless tobacco: Never  Vaping Use   Vaping Use: Never used  Substance Use Topics   Alcohol use: Never   Drug use: Never    Home Medications Prior to Admission medications   Medication Sig Start Date End Date Taking? Authorizing Provider  dicyclomine (BENTYL) 20 MG tablet  Take 1 tablet (20 mg total) by mouth 4 (four) times daily -  before meals and at bedtime. 06/08/18   Tressia Danas, MD  diphenhydrAMINE (BENADRYL) 25 MG tablet Take 50 mg by mouth every 6 (six) hours as needed for itching or allergies (cats).    [provider]  etonogestrel (NEXPLANON) 68 MG IMPL implant 1 each by Subdermal route once.    [provider]  famotidine (PEPCID) 20 MG tablet Take 1 tablet (20 mg total) by mouth 2 (two) times daily for 5 days. 06/11/20 06/16/20  Joy, Hillard Danker, PA-C  hydrocortisone (ANUSOL-HC) 2.5 % rectal cream Place 1 application rectally 2 (two) times daily. Provide applicator for internal hemorrhoids. Patient not taking: Reported on 06/08/2018 06/05/18   Excell Seltzer, MD  ibuprofen (ADVIL) 200 MG tablet Take 400 mg by mouth every 6 (six) hours as needed for headache or moderate pain.    [provider]  loratadine (CLARITIN) 10 MG tablet Take 1 tablet by mouth daily.    [provider]  montelukast (SINGULAIR) 10 MG tablet Take 1 tablet by mouth daily. 05/24/18   [provider]  ondansetron (ZOFRAN ODT) 4 MG disintegrating tablet Take 1 tablet (4 mg total) by mouth every 8 (eight) hours as needed for nausea or vomiting. 06/11/20   Joy, Shawn C, PA-C  pantoprazole (PROTONIX) 20 MG  tablet Take 1 tablet (20 mg total) by mouth 2 (two) times daily. 06/11/20 08/10/20  Joy, Hillard Danker, PA-C  PROAIR HFA 108 (90 Base) MCG/ACT inhaler Inhale 2 puffs into the lungs every 4 (four) hours as needed for wheezing or shortness of breath. 04/02/18   [provider]  sucralfate (CARAFATE) 1 g tablet Take 1 tablet (1 g total) by mouth 4 (four) times daily -  with meals and at bedtime. 06/11/20 07/11/20  Joy, Hillard Danker, PA-C  SYMBICORT 160-4.5 MCG/ACT inhaler Inhale 2 puffs into the lungs 2 (two) times daily. 04/30/18   [provider]    Allergies    Triamcinolone acetonide  Review of Systems   Review of Systems Ten systems reviewed  and are negative for acute change, except as noted in the HPI.    Physical Exam Updated Vital Signs BP 126/84   Pulse 71   Temp 98.5 F (36.9 C)   Resp 16   SpO2 100%   Physical Exam Vitals and nursing note reviewed.  Constitutional:      General: She is not in acute distress.    Appearance: She is well-developed. She is not diaphoretic.     Comments: Nontoxic appearing. Wearing sunglasses 2/2 photophobia.  HENT:     Head: Normocephalic and atraumatic.     Mouth/Throat:     Mouth: Mucous membranes are moist.  Eyes:     General: No scleral icterus.    Extraocular Movements: Extraocular movements intact.     Conjunctiva/sclera: Conjunctivae normal.  Neck:     Comments: No meningismus  Pulmonary:     Effort: Pulmonary effort is normal. No respiratory distress.     Comments: Respirations even and unlabored Musculoskeletal:        General: Normal range of motion.     Cervical back: Normal range of motion.  Skin:    General: Skin is warm and dry.     Coloration: Skin is not pale.     Findings: No erythema or rash.  Neurological:     Mental Status: She is alert and oriented to person, place, and time.     Coordination: Coordination normal.     Comments: GCS 15. Speech is goal oriented. No cranial nerve deficits appreciated; symmetric eyebrow raise, no facial drooping, tongue midline. Patient has equal grip strength bilaterally with 5/5 strength against resistance in all major muscle groups bilaterally. Sensation to light touch intact. Patient moves extremities without ataxia. Normal finger-nose-finger. Patient ambulatory with steady gait.  Psychiatric:        Behavior: Behavior normal.    ED Results / Procedures / Treatments   Labs (all labs ordered are listed, but only abnormal results are displayed) Labs Reviewed  RESP PANEL BY RT-PCR (FLU A&B, COVID) ARPGX2  PREGNANCY, URINE    EKG None  Radiology No results found.  Procedures Procedures   Medications  Ordered in ED Medications  magnesium sulfate IVPB 2 g 50 mL (2 g Intravenous New Bag/Given 01/03/21 2315)  ketorolac (TORADOL) 15 MG/ML injection 15 mg (15 mg Intravenous Given 01/03/21 2121)  metoCLOPramide (REGLAN) injection 10 mg (10 mg Intravenous Given 01/03/21 2123)  sodium chloride 0.9 % bolus 1,000 mL (0 mLs Intravenous Stopped 01/03/21 2315)  dexamethasone (DECADRON) tablet 10 mg (10 mg Oral Given 01/03/21 2316)    ED Course  I have reviewed the triage vital signs and the nursing notes.  Pertinent labs & imaging results that were available during my care of the patient were reviewed  by me and considered in my medical decision making (see chart for details).  Clinical Course as of 01/03/21 2347  Wed Jan 03, 2021  2120 Medications given. Pending reassessment of symptoms. VSS. [KH]  2223 Patient reports that headache is at 5/10 from 7/10 when roomed in the ED. Still c/o photophobia. Visibly anxious. Neurologic exam nonfocal. Given atypical nature of headache w/o hx of migraines, will add head CT pending additional medications for symptom management. [KH]    Clinical Course User Index [KH] Darylene Price   MDM Rules/Calculators/A&P                           20 year old female presents to the emergency department for further evaluation of 3 days of a right frontal headache.  She has had some associated blurry vision which is transient.  No focal neurologic deficits.  Has had some mild improvement with Toradol, Reglan.  Pending further evaluation with CT.  Care signed out to [place], [place] at change of shift.   Final Clinical Impression(s) / ED Diagnoses Final diagnoses:  Bad headache    Rx / DC Orders ED Discharge Orders     None        Antony Madura, PA-C 01/03/21 2348    Arby Barrette, MD 01/05/21 1555

## 2021-01-03 NOTE — Patient Instructions (Signed)
Based on what you shared with me, I feel your condition warrants further evaluation as soon as possible at an Emergency department.    NOTE: There will be NO CHARGE for this visit.   If you are having a true medical emergency please call 911.      Emergency Department-[facility]  Get Driving Directions  [phone]  [address]  [place], [place] [address]  Open 24/7/365      [place] [facility] at [address]  Get Driving Directions  [address]  [place], [place] [address]  Open 24/7/365    Emergency Department- [facility]  Get Driving Directions  [phone]  [address]  [place], [place] [address]  Open 24/7/365      [facility]  Get Driving Directions  [phone]  [address]  [place], [place] [address]  Open 24/7/365    [place]  Emergency Department- [facility]  Get Driving Directions  [phone]  [address]  [place], [place] [address]  Open 24/7/365    [place]  Emergency Department- [facility] [place]  Get Driving Directions  [address]  [place], [place] [address]  Open 24/7/365    [place]  Emergency Department-  [facility]  Get Driving Directions  [phone]  [address]  [place], [place] [address]  Open 24/7/365    

## 2021-01-04 MED ORDER — AMOXICILLIN-POT CLAVULANATE 875-125 MG PO TABS: 1.0000 | ORAL_TABLET | Freq: Two times a day (BID) | ORAL | 0 refills | Status: DC

## 2021-01-04 MED ORDER — SALINE SPRAY 0.65 % NA SOLN: 1.0000 | NASAL | 0 refills | Status: DC | PRN

## 2021-01-04 NOTE — ED Provider Notes (Signed)
Patient signed out to me at shift change.  Patient here with right-sided headache. Reports never having had a headache similar to this before.  She states that she has had some intermittent blurry vision, but this is resolved.  CT shows no acute intracranial abnormality, no hydrocephalus.  There is almost complete opacification of the bilateral sphenoid sinuses and mucosal thickening of the ethmoid sinuses.  Patient further questioning, reports that she has had some cough and slight congestion.  We will cover her for sinusitis with Augmentin.  This may be the cause for her headaches.  She is grossly neurovascularly intact, patient stable for discharge.   Roxy Horseman, PA-C 01/04/21 0010    Cathren Laine, MD 01/04/21 [phone]

## 2021-01-04 NOTE — Discharge Instructions (Addendum)
Your CT scan didn't show any signs of swelling or excess fluid on the brain.  It did show evidence of sinusitis.  This could be the cause of your headache.  Take antibiotics as prescribed.  Make an appointment at the clinic listed, or follow-up with your doctor for new or worsening symptoms.

## 2021-01-30 ENCOUNTER — Encounter (HOSPITAL_COMMUNITY): Payer: Self-pay

## 2021-01-30 ENCOUNTER — Ambulatory Visit (HOSPITAL_COMMUNITY)
Admission: EM | Admit: 2021-01-30 | Discharge: 2021-01-30 | Disposition: A | Discharge status: Home / Self Care | Payer: Medicaid Other | Attending: Urgent Care | Admitting: Urgent Care

## 2021-01-30 ENCOUNTER — Other Ambulatory Visit: Payer: Self-pay

## 2021-01-30 DIAGNOSIS — J0120 Acute ethmoidal sinusitis, unspecified: Secondary | ICD-10-CM

## 2021-01-30 DIAGNOSIS — J4521 Mild intermittent asthma with (acute) exacerbation: Secondary | ICD-10-CM | POA: Diagnosis not present

## 2021-01-30 MED ORDER — PREDNISONE 50 MG PO TABS: 50.0000 mg | ORAL_TABLET | Freq: Every day | ORAL | 0 refills | Status: AC

## 2021-01-30 MED ORDER — AMOXICILLIN-POT CLAVULANATE 875-125 MG PO TABS: 1.0000 | ORAL_TABLET | Freq: Two times a day (BID) | ORAL | 0 refills | Status: AC

## 2021-01-30 MED ORDER — MONTELUKAST SODIUM 10 MG PO TABS: 10.0000 mg | ORAL_TABLET | Freq: Every day | ORAL | 0 refills | Status: DC

## 2021-01-30 MED ORDER — ALBUTEROL SULFATE (2.5 MG/3ML) 0.083% IN NEBU: 2.5000 mg | INHALATION_SOLUTION | Freq: Four times a day (QID) | RESPIRATORY_TRACT | 0 refills | Status: AC | PRN

## 2021-01-30 MED ORDER — PROAIR RESPICLICK 108 (90 BASE) MCG/ACT IN AEPB: 2.0000 | INHALATION_SPRAY | Freq: Four times a day (QID) | RESPIRATORY_TRACT | 2 refills | Status: AC | PRN

## 2021-01-30 NOTE — ED Provider Notes (Signed)
[place]    CSN: [id number] Arrival date & time: 01/30/21  0941      History   Chief Complaint Chief Complaint  Patient presents with   Asthma     HPI Anna Roberts is a 21 y.o. female.   Pleasant 21 year old female with a known history of asthma presents today with concerns of cough and congestion over the past 3 weeks.  She was seen in the emergency room roughly 1 month ago and diagnosed with an ethmoid sinusitis.  She was treated for 7 days with Augmentin.  She admits that headache resolved, but around that time is when her cough also started.  She has nebulizer solution for her home nebulizer machine and albuterol inhaler, but both of them expired in 2019.  She states normally these help with her symptoms but due to them being expired feels they are ineffective    Past Medical History:  Diagnosis Date   Allergy    Asthma     Patient Active Problem List   Diagnosis Date Noted   Internal hemorrhoid 06/05/2018   Abdominal pain, periumbilical Q000111Q   STREP THROAT 04/03/2009   SORE THROAT 04/03/2009   OBESITY, UNSPECIFIED 05/26/2008   ALLERGIC RHINITIS 05/26/2008    Past Surgical History:  Procedure Laterality Date   KNEE DISLOCATION SURGERY      OB History   No obstetric history on file.      Home Medications    Prior to Admission medications   Medication Sig Start Date End Date Taking? Authorizing Provider  albuterol (PROVENTIL) (2.5 MG/3ML) 0.083% nebulizer solution Take 3 mLs (2.5 mg total) by nebulization every 6 (six) hours as needed for wheezing or shortness of breath. 01/30/21  Yes Cid Agena L, PA  Albuterol Sulfate (PROAIR RESPICLICK) 123XX123 (90 Base) MCG/ACT AEPB Inhale 2 puffs into the lungs every 6 (six) hours as needed (wheezing). 01/30/21  Yes Aneliese Beaudry L, PA  montelukast (SINGULAIR) 10 MG tablet Take 1 tablet (10 mg total) by mouth at bedtime. 01/30/21  Yes Stefhanie Kachmar L, PA  predniSONE (DELTASONE) 50 MG tablet Take 1 tablet  (50 mg total) by mouth daily with breakfast for 5 days. 01/30/21 02/04/21 Yes Fritz Cauthon L, PA  amoxicillin-clavulanate (AUGMENTIN) 875-125 MG tablet Take 1 tablet by mouth 2 (two) times daily for 7 days. 01/30/21 02/06/21  Madsen Riddle L, PA  dicyclomine (BENTYL) 20 MG tablet Take 1 tablet (20 mg total) by mouth 4 (four) times daily -  before meals and at bedtime. 06/08/18   Thornton Park, MD  diphenhydrAMINE (BENADRYL) 25 MG tablet Take 50 mg by mouth every 6 (six) hours as needed for itching or allergies (cats).    [provider]  etonogestrel (NEXPLANON) 68 MG IMPL implant 1 each by Subdermal route once.    [provider]  famotidine (PEPCID) 20 MG tablet Take 1 tablet (20 mg total) by mouth 2 (two) times daily for 5 days. 06/11/20 06/16/20  Joy, Shawn C, PA-C  ibuprofen (ADVIL) 200 MG tablet Take 400 mg by mouth every 6 (six) hours as needed for headache or moderate pain.    [provider]  loratadine (CLARITIN) 10 MG tablet Take 1 tablet by mouth daily.    [provider]  ondansetron (ZOFRAN ODT) 4 MG disintegrating tablet Take 1 tablet (4 mg total) by mouth every 8 (eight) hours as needed for nausea or vomiting. 06/11/20   Joy, Shawn C, PA-C  pantoprazole (PROTONIX) 20 MG tablet Take  1 tablet (20 mg total) by mouth 2 (two) times daily. 06/11/20 08/10/20  Joy, Shawn C, PA-C  sodium chloride (OCEAN) 0.65 % SOLN nasal spray Place 1 spray into both nostrils as needed for congestion. 01/04/21   Montine Circle, PA-C  sucralfate (CARAFATE) 1 g tablet Take 1 tablet (1 g total) by mouth 4 (four) times daily -  with meals and at bedtime. 06/11/20 07/11/20  Joy, Helane Gunther, PA-C  SYMBICORT 160-4.5 MCG/ACT inhaler Inhale 2 puffs into the lungs 2 (two) times daily. 04/30/18   [provider]    Family History Family History  Problem Relation Age of Onset   Diabetes Maternal Aunt    Colon cancer Neg Hx    Esophageal cancer Neg Hx    Rectal cancer Neg Hx     Stomach cancer Neg Hx     Social History Social History   Tobacco Use   Smoking status: Passive Smoke Exposure - Never Smoker   Smokeless tobacco: Never  Vaping Use   Vaping Use: Never used  Substance Use Topics   Alcohol use: Never   Drug use: Never     Allergies   Triamcinolone acetonide   Review of Systems Review of Systems As per hpi  Physical Exam Triage Vital Signs ED Triage Vitals  Enc Vitals Group     BP 01/30/21 1235 135/84     Pulse Rate 01/30/21 1235 89     Resp 01/30/21 1235 18     Temp 01/30/21 1235 99.1 F (37.3 C)     Temp Source 01/30/21 1235 Oral     SpO2 01/30/21 1235 94 %     Weight --      Height --      Head Circumference --      Peak Flow --      Pain Score 01/30/21 1234 1     Pain Loc --      Pain Edu? --      Excl. in [place]? --    No data found.  Updated Vital Signs BP 135/84 (BP Location: Right Arm)    Pulse 89    Temp 99.1 F (37.3 C) (Oral)    Resp 18    SpO2 94%   Visual Acuity Right Eye Distance:   Left Eye Distance:   Bilateral Distance:    Right Eye Near:   Left Eye Near:    Bilateral Near:     Physical Exam Vitals and nursing note reviewed.  Constitutional:      General: She is not in acute distress.    Appearance: She is well-developed. She is obese. She is not ill-appearing or toxic-appearing.  HENT:     Head: Normocephalic and atraumatic.     Right Ear: Tympanic membrane, ear canal and external ear normal. There is no impacted cerumen.     Left Ear: Tympanic membrane, ear canal and external ear normal. There is no impacted cerumen.     Nose: Nose normal. No congestion or rhinorrhea.     Mouth/Throat:     Mouth: Mucous membranes are moist.     Pharynx: Oropharynx is clear. No oropharyngeal exudate or posterior oropharyngeal erythema.  Eyes:     General: No scleral icterus.       Right eye: No discharge.        Left eye: No discharge.     Extraocular Movements: Extraocular movements intact.      Conjunctiva/sclera: Conjunctivae normal.     Pupils: Pupils are equal,  round, and reactive to light.  Cardiovascular:     Rate and Rhythm: Normal rate and regular rhythm.     Heart sounds: No murmur heard. Pulmonary:     Effort: Pulmonary effort is normal. No accessory muscle usage, respiratory distress or retractions.     Breath sounds: Normal air entry. No stridor, decreased air movement or transmitted upper airway sounds. Wheezing (mild, all posterior lung field) present. No decreased breath sounds, rhonchi or rales.  Abdominal:     Palpations: Abdomen is soft.     Tenderness: There is no abdominal tenderness.  Musculoskeletal:        General: No swelling.     Cervical back: Normal range of motion and neck supple. No rigidity or tenderness.  Lymphadenopathy:     Cervical: No cervical adenopathy.  Skin:    General: Skin is warm and dry.     Capillary Refill: Capillary refill takes less than 2 seconds.  Neurological:     Mental Status: She is alert.  Psychiatric:        Mood and Affect: Mood normal.     UC Treatments / Results  Labs (all labs ordered are listed, but only abnormal results are displayed) Labs Reviewed - No data to display  EKG   Radiology No results found.  Procedures Procedures (including critical care time)  Medications Ordered in UC Medications - No data to display  Initial Impression / Assessment and Plan / UC Course  I have reviewed the triage vital signs and the nursing notes.  Pertinent labs & imaging results that were available during my care of the patient were reviewed by me and considered in my medical decision making (see chart for details).     Mild intermittent asthma with acute exacerbation - Will Rx new neb solution and proair respiclick inhaler. Take prednisone for inflammation, start montelukast at night. Ethmoid sinusitis - was only treated for 7 days previously, will restart an additional 7 days.  Final Clinical Impressions(s) /  UC Diagnoses   Final diagnoses:  Mild intermittent asthma with acute exacerbation  Acute non-recurrent ethmoidal sinusitis     Discharge Instructions      Start using your nebulizer every 4 to 6 hours around-the-clock for the first 24 hours, then as needed Use ProAir Respiclick in place of your handheld albuterol solution Take prednisone daily, 1 tablet, best with breakfast Will start montelukast every night You were only given 7 days worth of antibiotics previously, so we will give you an additional 7 to complete the full course.  Take antibiotics with food, and eat yogurt to prevent diarrhea or yeast infection Establish care with an allergist or asthma specialist locally, for possible maintenance therapy    ED Prescriptions     Medication Sig Dispense Auth. Provider   amoxicillin-clavulanate (AUGMENTIN) 875-125 MG tablet Take 1 tablet by mouth 2 (two) times daily for 7 days. 14 tablet Casper Pagliuca L, PA   predniSONE (DELTASONE) 50 MG tablet Take 1 tablet (50 mg total) by mouth daily with breakfast for 5 days. 5 tablet Kyree Fedorko L, PA   Albuterol Sulfate (PROAIR RESPICLICK) 123XX123 (90 Base) MCG/ACT AEPB Inhale 2 puffs into the lungs every 6 (six) hours as needed (wheezing). 1 each Aura Bibby L, PA   albuterol (PROVENTIL) (2.5 MG/3ML) 0.083% nebulizer solution Take 3 mLs (2.5 mg total) by nebulization every 6 (six) hours as needed for wheezing or shortness of breath. 75 mL Jleigh Striplin L, PA   montelukast (SINGULAIR) 10 MG  tablet Take 1 tablet (10 mg total) by mouth at bedtime. 30 tablet Aundra Espin L, [place]      PDMP not reviewed this encounter.   Chaney Malling, [place] 01/30/21 2222

## 2021-01-30 NOTE — Discharge Instructions (Addendum)
Start using your nebulizer every 4 to 6 hours around-the-clock for the first 24 hours, then as needed Use ProAir Respiclick in place of your handheld albuterol solution Take prednisone daily, 1 tablet, best with breakfast Will start montelukast every night You were only given 7 days worth of antibiotics previously, so we will give you an additional 7 to complete the full course.  Take antibiotics with food, and eat yogurt to prevent diarrhea or yeast infection Establish care with an allergist or asthma specialist locally, for possible maintenance therapy

## 2021-01-30 NOTE — ED Triage Notes (Signed)
Pt presents with ongoing shortness of breath X 3 weeks after being treated for sinus infection and is not getting much relief from inhaler.

## 2022-03-25 ENCOUNTER — Telehealth: Payer: Medicaid Other | Admitting: Family Medicine

## 2022-03-25 DIAGNOSIS — R634 Abnormal weight loss: Secondary | ICD-10-CM

## 2022-03-25 DIAGNOSIS — R109 Unspecified abdominal pain: Secondary | ICD-10-CM

## 2022-03-25 NOTE — Progress Notes (Signed)
[place]   Weight loss that is greater than intended per patient- needs in person for evaluation and labs/referral/follow up needs. I have provided with referral service line, can also go to UC if pain in stomach does not improve.  Patient acknowledged agreement and understanding of the plan.

## 2022-03-27 ENCOUNTER — Encounter: Payer: Medicaid Other | Admitting: Obstetrics and Gynecology

## 2022-04-03 ENCOUNTER — Ambulatory Visit (INDEPENDENT_AMBULATORY_CARE_PROVIDER_SITE_OTHER): Payer: Medicaid Other | Admitting: Advanced Practice Midwife

## 2022-04-03 ENCOUNTER — Encounter: Payer: Self-pay | Admitting: Advanced Practice Midwife

## 2022-04-03 VITALS — BP 116/72 | HR 79 | Ht 70.0 in | Wt 241.0 lb

## 2022-04-03 DIAGNOSIS — Z3046 Encounter for surveillance of implantable subdermal contraceptive: Secondary | ICD-10-CM

## 2022-04-03 NOTE — Progress Notes (Signed)
   [place] REMOVAL Patient name: Anna Roberts MRN [id number]  Date of birth: 11-18-2000 Subjective Findings:   Anna Roberts is a 22 y.o. No obstetric history on file. Caucasian female being seen today for removal of a Nexplanon. Her Nexplanon was placed at a practice in [place] in April 2021.  She desires removal because the expiration is coming up, and she has started having more frequent vag bleeding. Signed copy of informed consent in chart.   Patient's last menstrual period was 03/21/2022. Last pap~age 30 in [place]. Results were:  normal per pt report The planned method of family planning is abstinence     04/03/2022    9:01 AM  Depression screen PHQ 2/9  Decreased Interest 2  Down, Depressed, Hopeless 1  PHQ - 2 Score 3  Altered sleeping 2  Tired, decreased energy 1  Change in appetite 2  Feeling bad or failure about yourself  2  Trouble concentrating 2  Moving slowly or fidgety/restless 1  Suicidal thoughts 1  PHQ-9 Score 14        04/03/2022    9:02 AM  GAD 7 : Generalized Anxiety Score  Nervous, Anxious, on Edge 2  Control/stop worrying 2  Worry too much - different things 2  Trouble relaxing 2  Restless 2  Easily annoyed or irritable 3  Afraid - awful might happen 1  Total GAD 7 Score 14     Pertinent History Reviewed:   Reviewed past medical,surgical, social, obstetrical and family history.  Reviewed problem list, medications and allergies. Objective Findings & Procedure:    Vitals:   04/03/22 0851  BP: 116/72  Pulse: 79  Weight: 241 lb (109.3 kg)  Height: '5\' 10"'$  (1.778 m)  Body mass index is 34.58 kg/m.  No results found for this or any previous visit (from the past 24 hour(s)).   Time out was performed.  Nexplanon site identified.  Area prepped in usual sterile fashon. One cc of 2% lidocaine was used to anesthetize the area at the distal end of the implant. A small stab incision was made right beside the implant on the distal portion.  The  Nexplanon rod was grasped using hemostats and removed without difficulty.  There was less than 3 cc blood loss. There were no complications.  Steri-strips were applied over the small incision and a pressure bandage was applied.  The patient tolerated the procedure well. Assessment & Plan:   1) Nexplanon removal She was instructed to keep the area clean and dry, remove pressure bandage in 24 hours, and keep insertion site covered with the steri-strip for 3-5 days.   Follow-up PRN problems.  No orders of the defined types were placed in this encounter.   Follow-up: Return for Pap & Physical, 1st available.  Myrtis Ser CNM 04/03/2022 9:19 AM

## 2022-04-03 NOTE — Patient Instructions (Signed)
Keep your bandage on x 24 hours, then take your steri strips off in 3 days.

## 2022-04-08 ENCOUNTER — Ambulatory Visit (INDEPENDENT_AMBULATORY_CARE_PROVIDER_SITE_OTHER): Payer: Medicaid Other | Admitting: Family Medicine

## 2022-04-08 ENCOUNTER — Encounter: Payer: Self-pay | Admitting: Family Medicine

## 2022-04-08 VITALS — BP 114/79 | HR 68 | Ht 70.0 in | Wt 237.1 lb

## 2022-04-08 DIAGNOSIS — R7301 Impaired fasting glucose: Secondary | ICD-10-CM

## 2022-04-08 DIAGNOSIS — E7849 Other hyperlipidemia: Secondary | ICD-10-CM

## 2022-04-08 DIAGNOSIS — Z114 Encounter for screening for human immunodeficiency virus [HIV]: Secondary | ICD-10-CM

## 2022-04-08 DIAGNOSIS — R634 Abnormal weight loss: Secondary | ICD-10-CM

## 2022-04-08 DIAGNOSIS — E0789 Other specified disorders of thyroid: Secondary | ICD-10-CM | POA: Diagnosis not present

## 2022-04-08 DIAGNOSIS — E559 Vitamin D deficiency, unspecified: Secondary | ICD-10-CM | POA: Diagnosis not present

## 2022-04-08 DIAGNOSIS — Z1159 Encounter for screening for other viral diseases: Secondary | ICD-10-CM

## 2022-04-08 NOTE — Assessment & Plan Note (Signed)
She reports unintentional weight loss in December, noting that she lost 50% of her weight She reports having abdominal cramps and constipation during that time Not reported today She followed up with GI and had an upper endoscopy completed on 06/30/2018 It was noted on the exam that she had a medium nonbleeding erosion of the gastric antrum, but her exam was otherwise without abnormalities She had a CT scan of her abdomen and pelvis on 06/11/2020 with no acute findings in the abdomen or pelvis She voices no complaints  No rectal bleeding was reported She reports adequate p.o. intake Denies fever, night sweats, and chills Will assess for CMP, CBC, and thyroid levels today

## 2022-04-08 NOTE — Patient Instructions (Addendum)
I appreciate the opportunity to provide care to you today!    Follow up:  3 months  Labs: please stop by the lab today to get your blood drawn (CBC, CMP, TSH, Lipid profile, HgA1c, Vit D)  Please continue to a heart-healthy diet and increase your physical activities. Try to exercise for 30mins at least five times a week.   Physical activity helps: Lower your blood glucose, improve your heart health, lower your blood pressure and cholesterol, burn calories to help manage her weight, gave you energy, lower stress, and improve his sleep.  The American diabetes Association (ADA) recommends being active for 2-1/2 hours (150 minutes) or more week.  Exercise for 30 minutes, 5 days a week (150 minutes total)    It was a pleasure to see you and I look forward to continuing to work together on your health and well-being. Please do not hesitate to call the office if you need care or have questions about your care.   Have a wonderful day and week. With Gratitude, Patryck Kilgore MSN, FNP-BC  

## 2022-04-08 NOTE — Progress Notes (Signed)
New Patient Office Visit  Subjective:  Patient ID: Anna Roberts, female    DOB: [birth year]  Age: 22 y.o. MRN: [id number]  CC:  Chief Complaint  Patient presents with   Establish Care    Establishing care and f/u from urgent care visit on 03/25/22, due to weight loss.     HPI Anna Roberts is a 22 y.o. female with past medical history of allergic rhinitis, obesity, abdominal pain, and internal hemorrhoids presents for establishing care. For the details of today's visit, please refer to the assessment and plan.       Past Medical History:  Diagnosis Date   Allergy    Asthma     Past Surgical History:  Procedure Laterality Date   KNEE DISLOCATION SURGERY      Family History  Problem Relation Age of Onset   Stroke Maternal Grandmother    Heart attack Maternal Grandfather    Stroke Maternal Grandfather    Heart attack Mother    Diabetes Maternal Aunt    Colon cancer Neg Hx    Esophageal cancer Neg Hx    Rectal cancer Neg Hx    Stomach cancer Neg Hx     Social History   Socioeconomic History   Marital status: Single    Spouse name: Not on file   Number of children: Not on file   Years of education: Not on file   Highest education level: Not on file  Occupational History   Occupation: [employer]  Tobacco Use   Smoking status: Never    Passive exposure: Yes   Smokeless tobacco: Never  Vaping Use   Vaping Use: Never used  Substance and Sexual Activity   Alcohol use: Yes    Comment: occasionally   Drug use: Never   Sexual activity: Not Currently    Birth control/protection: Implant  Other Topics Concern   Not on file  Social History Narrative   Not on file   Social Determinants of Health   Financial Resource Strain: Medium Risk (04/03/2022)   Overall Financial Resource Strain (CARDIA)    Difficulty of Paying Living Expenses: Somewhat hard  Food Insecurity: Food Insecurity Present (04/03/2022)   Hunger Vital Sign    Worried About Running Out of Food in the  Last Year: Sometimes true    Ran Out of Food in the Last Year: Sometimes true  Transportation Needs: No Transportation Needs (04/03/2022)   PRAPARE - Hydrologist (Medical): No    Lack of Transportation (Non-Medical): No  Physical Activity: Inactive (04/03/2022)   Exercise Vital Sign    Days of Exercise per Week: 0 days    Minutes of Exercise per Session: 0 min  Stress: Stress Concern Present (04/03/2022)   [place]    Feeling of Stress : To some extent  Social Connections: Socially Isolated (04/03/2022)   Social Connection and Isolation Panel [NHANES]    Frequency of Communication with Friends and Family: More than three times a week    Frequency of Social Gatherings with Friends and Family: Never    Attends Religious Services: Never    Marine scientist or Organizations: No    Attends Archivist Meetings: Never    Marital Status: Never married  Intimate Partner Violence: Not At Risk (04/03/2022)   Humiliation, Afraid, Rape, and Kick questionnaire    Fear of Current or Ex-Partner: No    Emotionally Abused: No  Physically Abused: No    Sexually Abused: No    ROS Review of Systems  Constitutional:  Negative for chills and fever.  Eyes:  Negative for visual disturbance.  Respiratory:  Negative for chest tightness and shortness of breath.   Neurological:  Negative for dizziness and headaches.    Objective:   Today's Vitals: BP 114/79   Pulse 68   Ht '5\' 10"'$  (1.778 m)   Wt 237 lb 1.9 oz (107.6 kg)   LMP 04/07/2022   SpO2 96%   BMI 34.02 kg/m   Physical Exam HENT:     Head: Normocephalic.     Mouth/Throat:     Mouth: Mucous membranes are moist.  Cardiovascular:     Rate and Rhythm: Normal rate.     Heart sounds: Normal heart sounds.  Pulmonary:     Effort: Pulmonary effort is normal.     Breath sounds: Wheezing present.  Neurological:     Mental Status: She is  alert.      Assessment & Plan:   Weight loss, unintentional Assessment & Plan: She reports unintentional weight loss in December, noting that she lost 50% of her weight She reports having abdominal cramps and constipation during that time Not reported today She followed up with GI and had an upper endoscopy completed on 06/30/2018 It was noted on the exam that she had a medium nonbleeding erosion of the gastric antrum, but her exam was otherwise without abnormalities She had a CT scan of her abdomen and pelvis on 06/11/2020 with no acute findings in the abdomen or pelvis She voices no complaints  No rectal bleeding was reported She reports adequate p.o. intake Denies fever, night sweats, and chills Will assess for CMP, CBC, and thyroid levels today    Other specified disorders of thyroid -     TSH + free T4  Other hyperlipidemia -     CBC with Differential/Platelet -     CMP14+EGFR -     Lipid panel  Vitamin D deficiency -     VITAMIN D 25 Hydroxy (Vit-D Deficiency, Fractures)  Impaired fasting blood sugar -     Hemoglobin A1c  Encounter for hepatitis C screening test for low risk patient -     Hepatitis C antibody  Encounter for screening for HIV -     HIV Antibody (routine testing w rflx)     Follow-up: Return in about 3 months (around 07/09/2022).   Alvira Monday, FNP

## 2022-04-23 ENCOUNTER — Encounter: Payer: Self-pay | Admitting: Obstetrics & Gynecology

## 2022-04-23 ENCOUNTER — Ambulatory Visit (INDEPENDENT_AMBULATORY_CARE_PROVIDER_SITE_OTHER): Payer: Medicaid Other | Admitting: Obstetrics & Gynecology

## 2022-04-23 ENCOUNTER — Other Ambulatory Visit (HOSPITAL_COMMUNITY)
Admission: RE | Admit: 2022-04-23 | Discharge: 2022-04-23 | Disposition: A | Discharge status: Home / Self Care | Payer: Medicaid Other | Source: Ambulatory Visit | Attending: Obstetrics & Gynecology | Admitting: Obstetrics & Gynecology

## 2022-04-23 VITALS — BP 119/75 | HR 82 | Ht 70.0 in | Wt 237.4 lb

## 2022-04-23 DIAGNOSIS — Z01419 Encounter for gynecological examination (general) (routine) without abnormal findings: Secondary | ICD-10-CM

## 2022-04-23 DIAGNOSIS — Z113 Encounter for screening for infections with a predominantly sexual mode of transmission: Secondary | ICD-10-CM | POA: Diagnosis not present

## 2022-04-23 NOTE — Progress Notes (Signed)
   WELL-WOMAN EXAMINATION Patient name: Anna Roberts MRN [id number]  Date of birth: 2000/01/31 Chief Complaint:   Gynecologic Exam  History of Present Illness:   ILIAN Roberts is a 22 y.o. G40 female being seen today for a routine well-woman exam.  Today she notes no acute complaints or concerns.  Nexplanon recently removed- not yet had a period since removal.  Denies irregular discharge, itching or irritation.  Patient's last menstrual period was 04/07/2022. Denies issues with her menses The current method of family planning is abstinence.    Last pap collected today.  Last mammogram: n/a. Last colonoscopy: n/a     04/23/2022   11:20 AM 04/08/2022   11:05 AM 04/03/2022    9:01 AM  Depression screen PHQ 2/9  Decreased Interest 2 1 2   Down, Depressed, Hopeless 1 1 1   PHQ - 2 Score 3 2 3   Altered sleeping 2 1 2   Tired, decreased energy 3 1 1   Change in appetite 3 1 2   Feeling bad or failure about yourself  1 1 2   Trouble concentrating 2 2 2   Moving slowly or fidgety/restless 1 0 1  Suicidal thoughts 0 0 1  PHQ-9 Score 15 8 14   Difficult doing work/chores  Not difficult at all       Review of Systems:   Pertinent items are noted in HPI Denies any headaches, blurred vision, fatigue, shortness of breath, chest pain, abdominal pain, bowel movements, urination, or intercourse unless otherwise stated above.  Pertinent History Reviewed:  Reviewed past medical,surgical, social and family history.  Reviewed problem list, medications and allergies. Physical Assessment:   Vitals:   04/23/22 1122  BP: 119/75  Pulse: 82  Weight: 237 lb 6.4 oz (107.7 kg)  Height: 5\' 10"  (1.778 m)  Body mass index is 34.06 kg/m.        Physical Examination:   General appearance - well appearing, and in no distress  Mental status - alert, oriented to person, place, and time  Psych:  She has a normal mood and affect  Skin - warm and dry, normal color, no suspicious lesions noted  Chest -  effort normal, all lung fields clear to auscultation bilaterally  Heart - normal rate and regular rhythm  Neck:  midline trachea, no thyromegaly or nodules  Breasts - breasts appear normal, no suspicious masses, no skin or nipple changes or  axillary nodes  Abdomen - soft, nontender, nondistended, no masses or organomegaly  Pelvic - VULVA: normal appearing vulva with no masses, tenderness or lesions  VAGINA: normal appearing vagina with normal color and discharge, no lesions  CERVIX: normal appearing cervix without discharge or lesions, no CMT  Thin prep pap is done with HR HPV cotesting  UTERUS: uterus is felt to be normal size, shape, consistency and nontender   ADNEXA: No adnexal masses or tenderness noted.  Extremities:  No swelling or varicosities noted  Chaperone:  pt declined      Assessment & Plan:  1) Well-Woman Exam -pap collected, reviewed screening guidelines   Follow-up: Return in about 1 year (around 04/23/2023) for Annual.   Janyth Pupa, DO Attending [place], [place] for [place], [place]

## 2022-04-24 LAB — CYTOLOGY - PAP
Chlamydia: NEGATIVE
Comment: NEGATIVE
Comment: NORMAL
Diagnosis: NEGATIVE
Neisseria Gonorrhea: NEGATIVE

## 2022-05-03 IMAGING — CT CT ABD-PELV W/O CM
2 of 4 series · 17 of 46 positions shown, 19 images · non-contrast
Comparison: None.

CLINICAL DATA: Abdominal pain, history of stomach ulcers.

EXAM:
CT ABDOMEN AND PELVIS WITHOUT CONTRAST
TECHNIQUE: Multidetector CT imaging of the abdomen and pelvis was performed
following the standard protocol without IV contrast.

[Series 7: cor st · coronal · 0.97mm/px · 3 of 101 slices shown]
[im 34/101  soft-tissue]
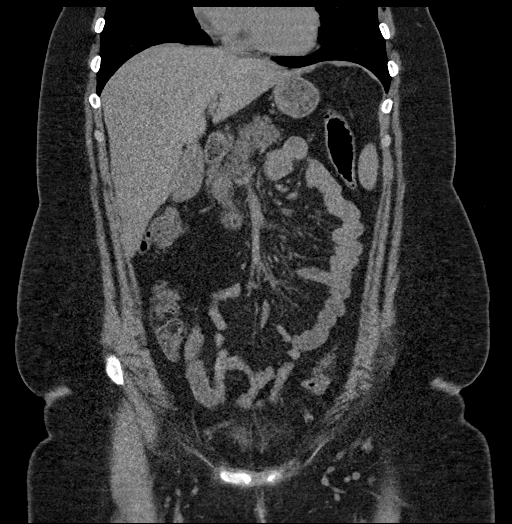
[im 45/101  soft-tissue]
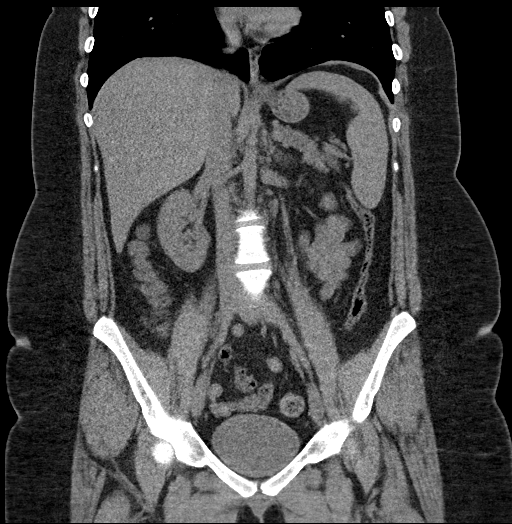
[im 56/101  soft-tissue]
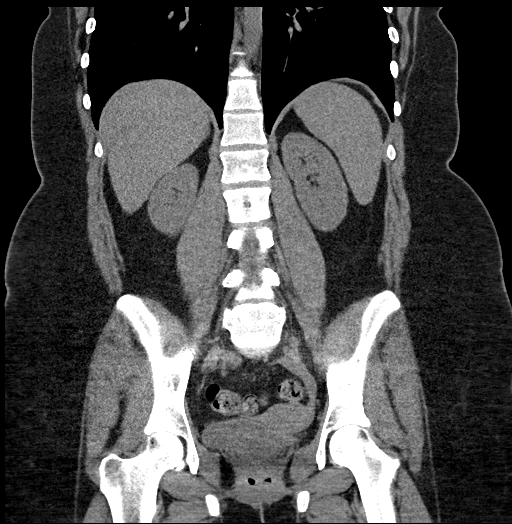

[Series 9: abd/ pelvis 5.0 i30f 2 · axial · 0.98mm/px · z∈[-536,-86]mm · 14 of 100 slices shown, 16 images]
[im 5/100  soft-tissue]
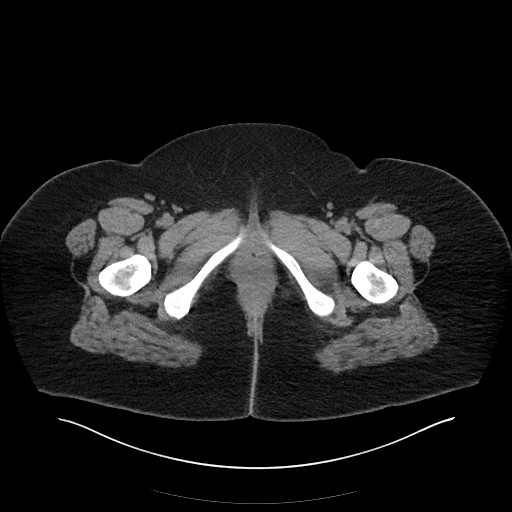
[im 5/100  bone]
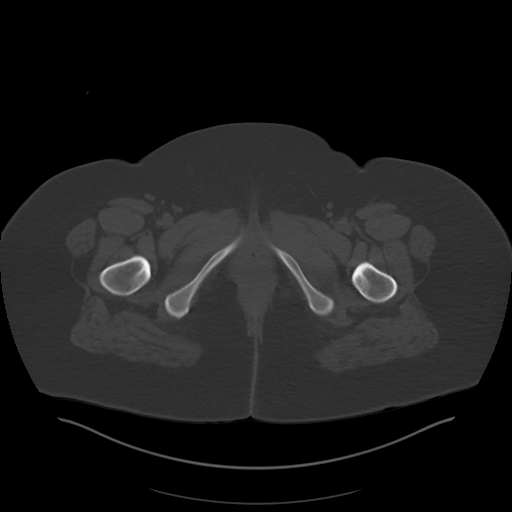
[im 13/100  soft-tissue]
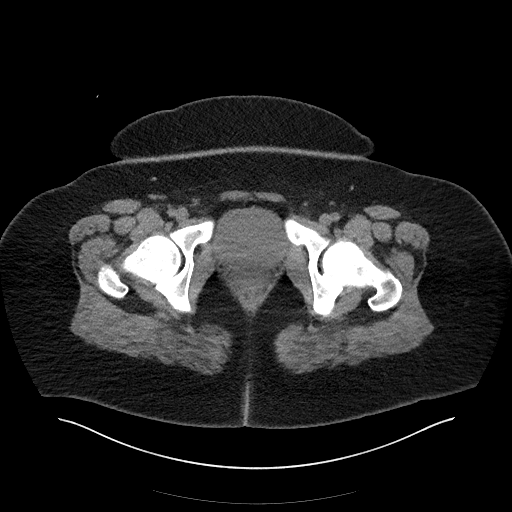
[im 18/100  soft-tissue]
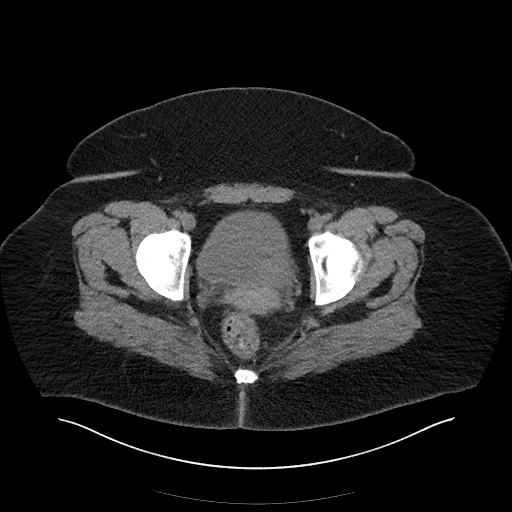
[im 26/100  soft-tissue]
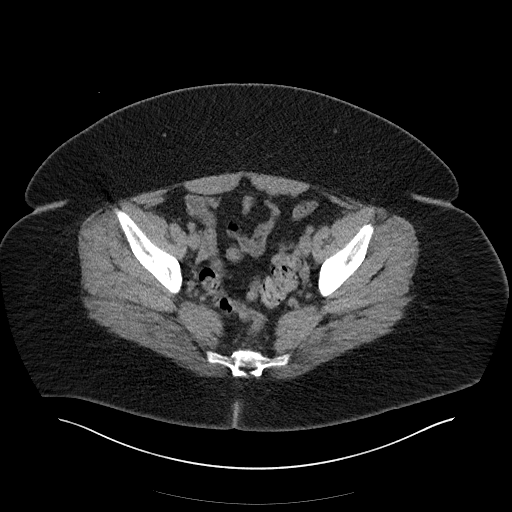
[im 35/100  soft-tissue]
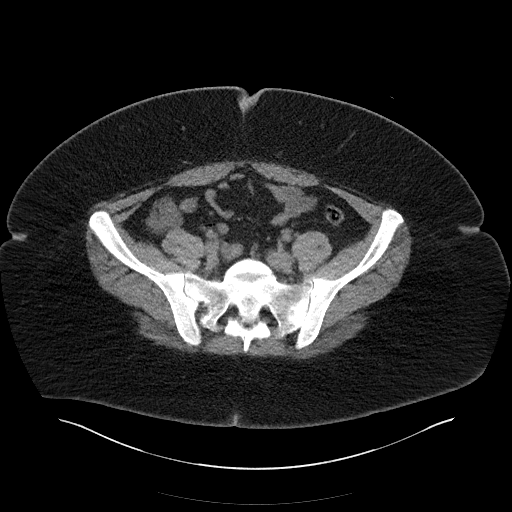
[im 39/100  soft-tissue]
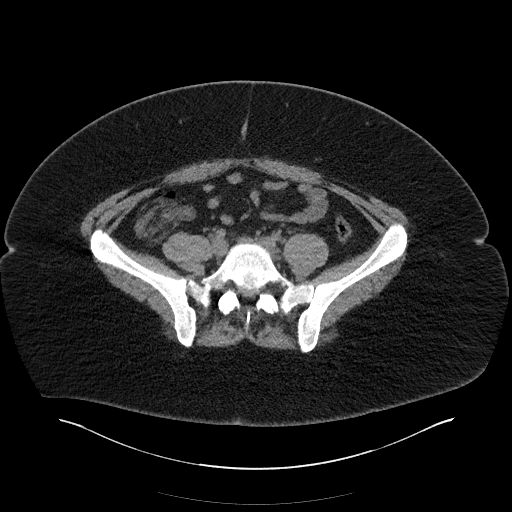
[im 48/100  soft-tissue]
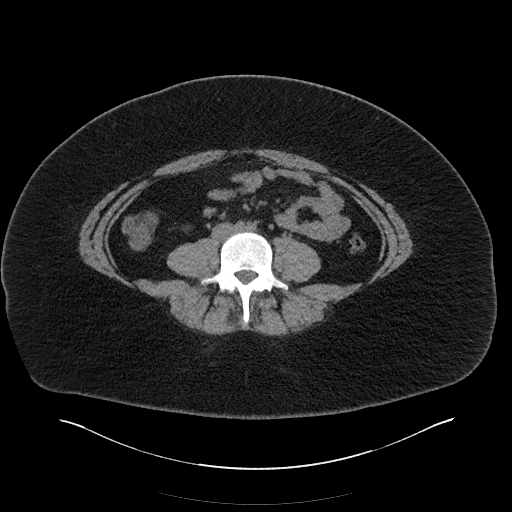
[im 52/100  soft-tissue]
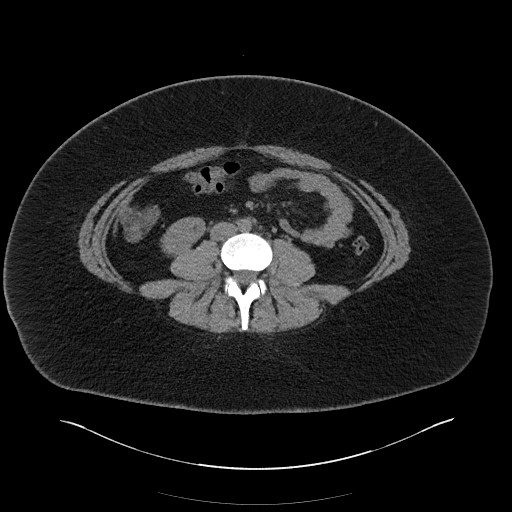
[im 61/100  soft-tissue]
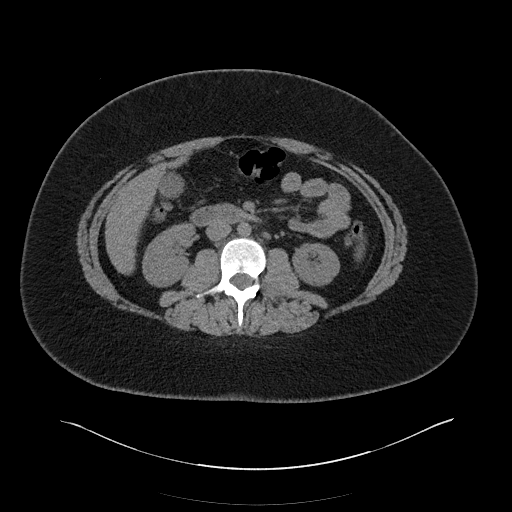
[im 61/100  bone]
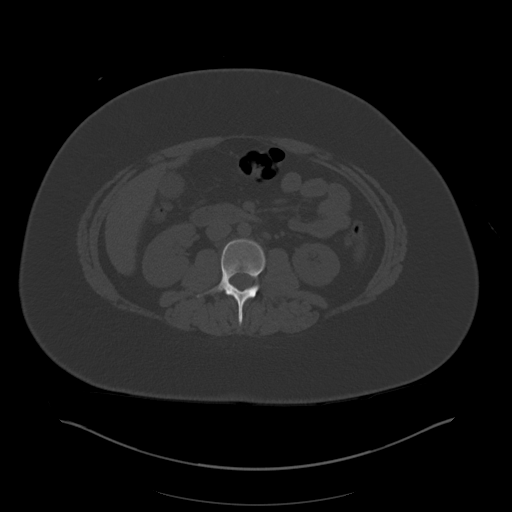
[im 65/100  soft-tissue]
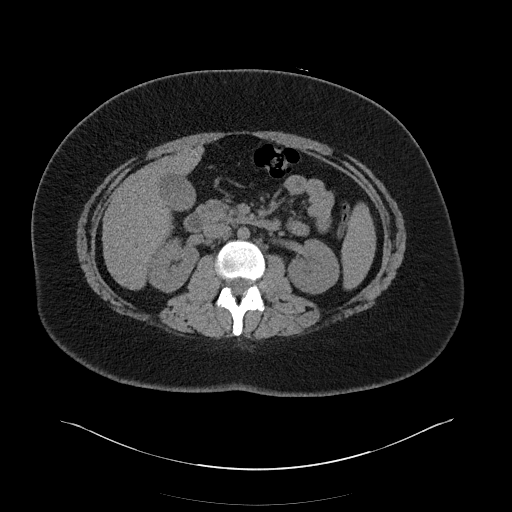
[im 74/100  soft-tissue]
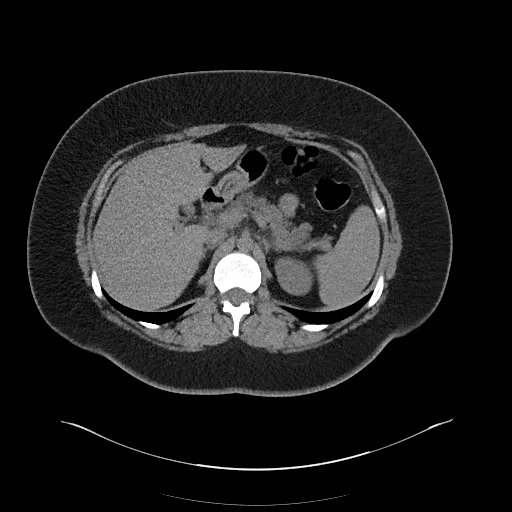
[im 82/100  soft-tissue]
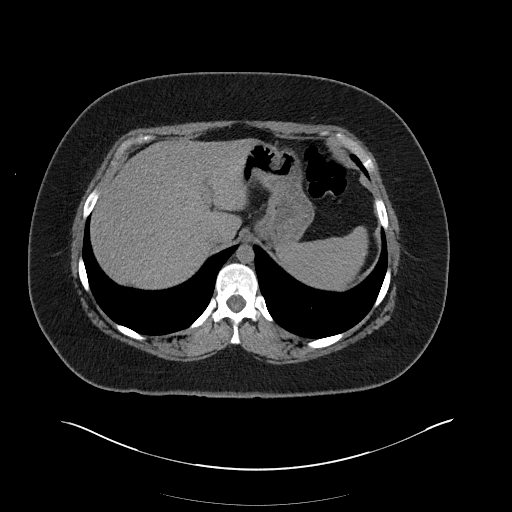
[im 87/100  soft-tissue]
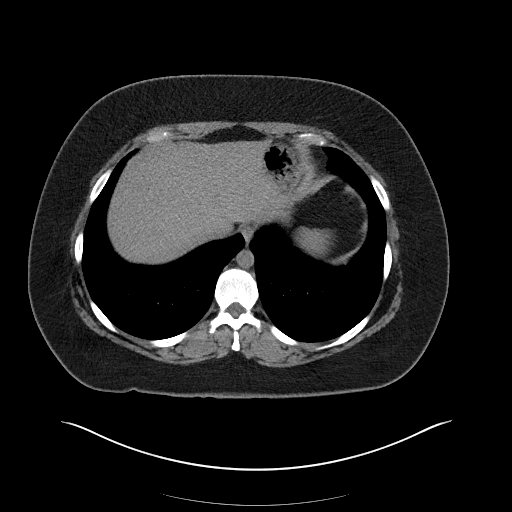
[im 95/100  soft-tissue]
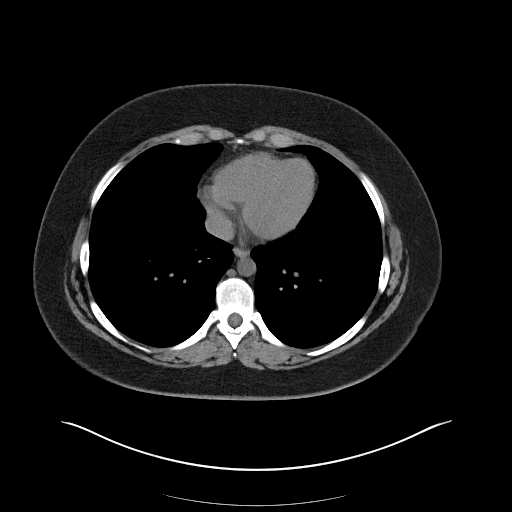

[17 of 46 positions shown; findings below may reference images not displayed]

FINDINGS: Lower chest: No acute abnormality.

Evaluation of the abdominal viscera limited by the lack of IV
contrast.

Hepatobiliary: No focal liver abnormality is seen. Normal
gallbladder.

Pancreas: Unremarkable. No surrounding inflammatory changes.

Spleen: Normal in size without focal abnormality.

Adrenals/Urinary Tract: Adrenal glands are unremarkable. Kidneys are
normal, without renal calculi, focal lesion, or hydronephrosis.
Bladder is unremarkable.

Stomach/Bowel: Stomach is within normal limits. Appendix appears
normal. No evidence of bowel wall thickening, distention, or
inflammatory changes.

Vascular/Lymphatic: Vascular patency cannot be assessed in the
absence IV contrast. No enlarged abdominal or pelvic lymph nodes.

Reproductive: Uterus and bilateral adnexa are unremarkable.

Other: No abdominal wall hernia or abnormality. No abdominopelvic
ascites.

Musculoskeletal: No acute or significant osseous findings.
IMPRESSION: No acute findings in the abdomen or pelvis on a noncontrast exam.

## 2022-07-10 ENCOUNTER — Ambulatory Visit: Payer: Medicaid Other | Admitting: Family Medicine

## 2023-04-14 ENCOUNTER — Other Ambulatory Visit (HOSPITAL_COMMUNITY)
Admission: RE | Admit: 2023-04-14 | Discharge: 2023-04-14 | Disposition: A | Discharge status: Home / Self Care | Payer: MEDICAID | Source: Ambulatory Visit | Attending: Obstetrics & Gynecology | Admitting: Obstetrics & Gynecology

## 2023-04-14 ENCOUNTER — Ambulatory Visit (INDEPENDENT_AMBULATORY_CARE_PROVIDER_SITE_OTHER): Payer: MEDICAID | Admitting: *Deleted

## 2023-04-14 DIAGNOSIS — Z113 Encounter for screening for infections with a predominantly sexual mode of transmission: Secondary | ICD-10-CM

## 2023-04-14 DIAGNOSIS — N760 Acute vaginitis: Secondary | ICD-10-CM | POA: Insufficient documentation

## 2023-04-14 DIAGNOSIS — B9689 Other specified bacterial agents as the cause of diseases classified elsewhere: Secondary | ICD-10-CM | POA: Insufficient documentation

## 2023-04-14 NOTE — Progress Notes (Signed)
   NURSE VISIT- VAGINITIS/STD  SUBJECTIVE:  Anna Roberts is a 23 y.o. No obstetric history on file. GYN patientfemale here for a vaginal swab for vaginitis screening, STD screen.  She reports the following symptoms: none.  Denies abnormal vaginal bleeding, significant pelvic pain, fever, or UTI symptoms.  OBJECTIVE:  There were no vitals taken for this visit.  Appears well, in no apparent distress  ASSESSMENT: Vaginal swab for vaginitis screening & STD screening.   PLAN: Self-collected vaginal probe for Gonorrhea, Chlamydia, Trichomonas, Bacterial Vaginosis, Yeast sent to lab Treatment: to be determined once results are received Follow-up as needed if symptoms persist/worsen, or new symptoms develop  Malachy Mood  04/14/2023 4:43 PM

## 2023-04-16 LAB — CERVICOVAGINAL ANCILLARY ONLY
Bacterial Vaginitis (gardnerella): POSITIVE — AB
Candida Glabrata: NEGATIVE
Candida Vaginitis: NEGATIVE
Chlamydia: NEGATIVE
Comment: NEGATIVE
Comment: NEGATIVE
Comment: NEGATIVE
Comment: NEGATIVE
Comment: NEGATIVE
Comment: NORMAL
Neisseria Gonorrhea: NEGATIVE
Trichomonas: NEGATIVE

## 2023-04-17 ENCOUNTER — Other Ambulatory Visit: Payer: Self-pay | Admitting: Adult Health

## 2023-04-17 MED ORDER — METRONIDAZOLE 500 MG PO TABS: 500.0000 mg | ORAL_TABLET | Freq: Two times a day (BID) | ORAL | 0 refills | Status: DC

## 2023-04-17 NOTE — Progress Notes (Signed)
+  BV on vaginal swab will rx flagyl,no sex or alcohol while taking  ?

## 2023-04-28 ENCOUNTER — Ambulatory Visit (INDEPENDENT_AMBULATORY_CARE_PROVIDER_SITE_OTHER): Payer: MEDICAID | Admitting: Family Medicine

## 2023-04-28 ENCOUNTER — Encounter: Payer: Self-pay | Admitting: Family Medicine

## 2023-04-28 VITALS — BP 137/80 | HR 67 | Ht 70.0 in | Wt 188.0 lb

## 2023-04-28 DIAGNOSIS — N750 Cyst of Bartholin's gland: Secondary | ICD-10-CM

## 2023-04-28 DIAGNOSIS — R7301 Impaired fasting glucose: Secondary | ICD-10-CM

## 2023-04-28 DIAGNOSIS — A64 Unspecified sexually transmitted disease: Secondary | ICD-10-CM

## 2023-04-28 DIAGNOSIS — Z114 Encounter for screening for human immunodeficiency virus [HIV]: Secondary | ICD-10-CM

## 2023-04-28 DIAGNOSIS — E038 Other specified hypothyroidism: Secondary | ICD-10-CM

## 2023-04-28 DIAGNOSIS — E559 Vitamin D deficiency, unspecified: Secondary | ICD-10-CM | POA: Diagnosis not present

## 2023-04-28 DIAGNOSIS — Z1159 Encounter for screening for other viral diseases: Secondary | ICD-10-CM

## 2023-04-28 DIAGNOSIS — E7849 Other hyperlipidemia: Secondary | ICD-10-CM

## 2023-04-28 MED ORDER — SULFAMETHOXAZOLE-TRIMETHOPRIM 800-160 MG PO TABS: 1.0000 | ORAL_TABLET | Freq: Two times a day (BID) | ORAL | 0 refills | Status: AC

## 2023-04-28 NOTE — Assessment & Plan Note (Signed)
 On examination, a lump consistent with a Bartholin's cyst was noted. Given the patient's complaints of pain and tenderness, treatment will be initiated with Bactrim (Trimethoprim-Sulfamethoxazole) twice daily for 5 days. Warm Sitz baths are also recommended--sit in a tub of warm water several times a day for 3-4 days. This can help the cyst drain naturally and provide symptom relief. The patient is encouraged to follow up if symptoms worsen or fail to improve.

## 2023-04-28 NOTE — Patient Instructions (Addendum)
 I appreciate the opportunity to provide care to you today!  Fasting labs Labs: please stop by the lab during the week to get your blood drawn (CBC, CMP, TSH, Lipid profile, HgA1c, Vit D)  A Bartholin's cyst is a fluid-filled lump that can form just inside the opening of a woman's vagina. On each side of the vaginal opening are Bartholin's glands, which normally produce a small amount of fluid to help keep the area moist. If one of these glands becomes blocked, the fluid builds up and forms a painless lump or swelling--this is what we call a Bartholin's cyst.  Most of the time, these cysts do not cause pain and may go away on their own. However, if the cyst becomes infected, it can turn into a painful abscess (a pocket of pus), which may require treatment.  I recommend the following: Warm Sitz Baths  Sit in a tub of warm water several times a day for 3-4 days.  This can help the cyst drain on its own and relieve discomfort.  Start taking an antibiotic:  Trimethoprim-sulfamethoxazole (Bactrim DS) - to help treat or prevent infection.      Please continue to a heart-healthy diet and increase your physical activities. Try to exercise for at least five days a week.    It was a pleasure to see you and I look forward to continuing to work together on your health and well-being. Please do not hesitate to call the office if you need care or have questions about your care.  In case of emergency, please visit the Emergency Department for urgent care, or contact our clinic at [phone] to schedule an appointment. We're here to help you!   Have a wonderful day and week. With Gratitude, Gilmore Laroche MSN, FNP-BC

## 2023-04-28 NOTE — Progress Notes (Signed)
 Established Patient Office Visit  Subjective:  Patient ID: Anna Roberts, female    DOB: 13-Sep-2000  Age: 23 y.o. MRN: [id number]  CC:  Chief Complaint  Patient presents with   Acute Visit    STD Screening Pt has recently left a relationship where there was cheating involved. She has since been in for a swab on 3/26 was positive for BV .  She has since found a growth on her left labia, that is hard and sore to the touch, symptoms lasting for the past week. Pt denies any frequent itching, or pain during urination.     HPI Anna Roberts is a 23 y.o. female  presents  with complaint listed in the chief complaints.  For the details of today's visit, please refer to the assessment and plan.     Past Medical History:  Diagnosis Date   Allergy    Asthma     Past Surgical History:  Procedure Laterality Date   KNEE DISLOCATION SURGERY      Family History  Problem Relation Age of Onset   Stroke Maternal Grandmother    Heart attack Maternal Grandfather    Stroke Maternal Grandfather    Heart attack Mother    Diabetes Maternal Aunt    Colon cancer Neg Hx    Esophageal cancer Neg Hx    Rectal cancer Neg Hx    Stomach cancer Neg Hx     Social History   Socioeconomic History   Marital status: Single    Spouse name: Not on file   Number of children: Not on file   Years of education: Not on file   Highest education level: Associate degree: occupational, Scientist, product/process development, or vocational program  Occupational History   Occupation: [employer]  Tobacco Use   Smoking status: Some Days    Current packs/day: 0.03    Types: Cigarettes    Passive exposure: Yes   Smokeless tobacco: Never  Vaping Use   Vaping status: Never Used  Substance and Sexual Activity   Alcohol use: Yes    Comment: occasionally   Drug use: Yes    Comment: cbd gummies   Sexual activity: Not Currently    Birth control/protection: Implant  Other Topics Concern   Not on file  Social History Narrative   Not on file    Social Drivers of Health   Financial Resource Strain: High Risk (04/26/2023)   Overall Financial Resource Strain (CARDIA)    Difficulty of Paying Living Expenses: Hard  Food Insecurity: Food Insecurity Present (04/26/2023)   Hunger Vital Sign    Worried About Running Out of Food in the Last Year: Sometimes true    Ran Out of Food in the Last Year: Never true  Transportation Needs: No Transportation Needs (04/26/2023)   PRAPARE - Administrator, Civil Service (Medical): No    Lack of Transportation (Non-Medical): No  Physical Activity: Sufficiently Active (04/26/2023)   Exercise Vital Sign    Days of Exercise per Week: 6 days    Minutes of Exercise per Session: 60 min  Stress: Stress Concern Present (04/26/2023)   [employer] of Occupational Health - Occupational Stress Questionnaire    Feeling of Stress : To some extent  Social Connections: Moderately Integrated (04/26/2023)   Social Connection and Isolation Panel [NHANES]    Frequency of Communication with Friends and Family: More than three times a week    Frequency of Social Gatherings with Friends and Family: More than three times  a week    Attends Religious Services: More than 4 times per year    Active Member of Clubs or Organizations: Yes    Attends Banker Meetings: More than 4 times per year    Marital Status: Never married  Intimate Partner Violence: Not At Risk (04/23/2022)   Humiliation, Afraid, Rape, and Kick questionnaire    Fear of Current or Ex-Partner: No    Emotionally Abused: No    Physically Abused: No    Sexually Abused: No    Outpatient Medications Prior to Visit  Medication Sig Dispense Refill   albuterol (PROVENTIL) (2.5 MG/3ML) 0.083% nebulizer solution Take 3 mLs (2.5 mg total) by nebulization every 6 (six) hours as needed for wheezing or shortness of breath. 75 mL 0   Albuterol Sulfate (PROAIR RESPICLICK) 108 (90 Base) MCG/ACT AEPB Inhale 2 puffs into the lungs every 6 (six)  hours as needed (wheezing). 1 each 2   diphenhydrAMINE HCl (ALLERGY MED PO) Take by mouth.     metroNIDAZOLE (FLAGYL) 500 MG tablet Take 1 tablet (500 mg total) by mouth 2 (two) times daily. (Patient not taking: Reported on 04/28/2023) 14 tablet 0   No facility-administered medications prior to visit.    Allergies  Allergen Reactions   Triamcinolone Acetonide Other (See Comments)    Skin irritation, burning, made skin worse     ROS Review of Systems  Constitutional:  Negative for chills and fever.  Eyes:  Negative for visual disturbance.  Respiratory:  Negative for chest tightness and shortness of breath.   Skin:        Painful bump on labial minora  Neurological:  Negative for dizziness and headaches.      Objective:    Physical Exam HENT:     Head: Normocephalic.     Mouth/Throat:     Mouth: Mucous membranes are moist.  Cardiovascular:     Rate and Rhythm: Normal rate.     Heart sounds: Normal heart sounds.  Pulmonary:     Effort: Pulmonary effort is normal.     Breath sounds: Normal breath sounds.  Genitourinary:    Exam position: Lithotomy position.       Comments: Marked location of tender, red lump Skin:    Comments: Tender, red lump located on the labial minora  Neurological:     Mental Status: She is alert.     BP 137/80   Pulse 67   Ht 5\' 10"  (1.778 m)   Wt 188 lb (85.3 kg)   LMP 04/16/2023   SpO2 (!) 82%   BMI 26.98 kg/m  Wt Readings from Last 3 Encounters:  04/28/23 188 lb (85.3 kg)  04/23/22 237 lb 6.4 oz (107.7 kg)  04/08/22 237 lb 1.9 oz (107.6 kg)    No results found for: "TSH" Lab Results  Component Value Date   WBC 6.7 06/11/2020   HGB 14.0 06/11/2020   HCT 42.6 06/11/2020   MCV 93.0 06/11/2020   PLT 343 06/11/2020   Lab Results  Component Value Date   NA 139 06/11/2020   K 3.9 06/11/2020   CO2 24 06/11/2020   GLUCOSE 81 06/11/2020   BUN 12 06/11/2020   CREATININE 0.91 06/11/2020   BILITOT 0.9 06/11/2020   ALKPHOS 68  06/11/2020   AST 26 06/11/2020   ALT 25 06/11/2020   PROT 7.9 06/11/2020   ALBUMIN 4.3 06/11/2020   CALCIUM 9.6 06/11/2020   ANIONGAP 8 06/11/2020   No results found for: "CHOL" No  results found for: "HDL" No results found for: "LDLCALC" No results found for: "TRIG" No results found for: "CHOLHDL" No results found for: "HGBA1C"    Assessment & Plan:  Bartholin's cyst Assessment & Plan: On examination, a lump consistent with a Bartholin's cyst was noted. Given the patient's complaints of pain and tenderness, treatment will be initiated with Bactrim (Trimethoprim-Sulfamethoxazole) twice daily for 5 days. Warm Sitz baths are also recommended--sit in a tub of warm water several times a day for 3-4 days. This can help the cyst drain naturally and provide symptom relief. The patient is encouraged to follow up if symptoms worsen or fail to improve.    Orders: -     Sulfamethoxazole-Trimethoprim; Take 1 tablet by mouth 2 (two) times daily for 5 days.  Dispense: 10 tablet; Refill: 0  STD (female) -     NuSwab Vaginitis Plus (VG+)  IFG (impaired fasting glucose) -     Hemoglobin A1c  Vitamin D deficiency -     VITAMIN D 25 Hydroxy (Vit-D Deficiency, Fractures)  Need for hepatitis C screening test -     Hepatitis C antibody  Encounter for screening for HIV -     HIV Antibody (routine testing w rflx)  TSH (thyroid-stimulating hormone deficiency) -     TSH + free T4  Other hyperlipidemia -     Lipid panel -     CMP14+EGFR -     CBC with Differential/Platelet  Note: This chart has been completed using Engineer, civil (consulting) software, and while attempts have been made to ensure accuracy, certain words and phrases may not be transcribed as intended.    Follow-up: No follow-ups on file.   Gilmore Laroche, FNP

## 2023-05-01 LAB — NUSWAB VAGINITIS PLUS (VG+)
Atopobium vaginae: HIGH {score} — AB
BVAB 2: HIGH {score} — AB
Candida albicans, NAA: NEGATIVE
Candida glabrata, NAA: NEGATIVE
Megasphaera 1: HIGH {score} — AB

## 2023-05-04 ENCOUNTER — Encounter: Payer: Self-pay | Admitting: Family Medicine

## 2023-09-08 ENCOUNTER — Ambulatory Visit: Payer: MEDICAID | Admitting: Family Medicine

## 2023-09-12 ENCOUNTER — Ambulatory Visit: Payer: MEDICAID | Admitting: Family Medicine

## 2023-09-12 ENCOUNTER — Ambulatory Visit: Payer: MEDICAID

## 2023-09-12 VITALS — BP 132/82 | HR 76 | Ht 70.0 in | Wt 167.0 lb

## 2023-09-12 DIAGNOSIS — J4520 Mild intermittent asthma, uncomplicated: Secondary | ICD-10-CM

## 2023-09-12 DIAGNOSIS — J020 Streptococcal pharyngitis: Secondary | ICD-10-CM | POA: Diagnosis not present

## 2023-09-12 DIAGNOSIS — B009 Herpesviral infection, unspecified: Secondary | ICD-10-CM

## 2023-09-12 MED ORDER — VALACYCLOVIR HCL 1 G PO TABS: 2000.0000 mg | ORAL_TABLET | Freq: Two times a day (BID) | ORAL | 0 refills | Status: AC

## 2023-09-12 MED ORDER — MONTELUKAST SODIUM 10 MG PO TABS: 10.0000 mg | ORAL_TABLET | Freq: Every day | ORAL | 5 refills | Status: AC

## 2023-09-12 MED ORDER — LIDOCAINE VISCOUS HCL 2 % MT SOLN: 5.0000 mL | Freq: Four times a day (QID) | OROMUCOSAL | 0 refills | Status: AC | PRN

## 2023-09-12 NOTE — Progress Notes (Unsigned)
   Established Patient Office Visit  Subjective   Patient ID: Anna Roberts, female    DOB: September 24, 2000  Age: 23 y.o. MRN: [id number]  Chief Complaint  Patient presents with   Medical Management of Chronic Issues    Pt states asthma, positive for Strep, and has fever blisters on her lips and tongue    HPI  Patient Active Problem List   Diagnosis Date Noted   Mild intermittent asthma without complication 09/19/2023   Herpes simplex 09/19/2023   Bartholin's cyst 04/28/2023   Weight loss, unintentional 04/08/2022   Internal hemorrhoid 06/05/2018   Abdominal pain, periumbilical 06/05/2018   OBESITY, UNSPECIFIED 05/26/2008   Allergic rhinitis 05/26/2008    ROS    Objective:     BP 132/82   Pulse 76   Ht 5' 10 (1.778 m)   Wt 167 lb (75.8 kg)   SpO2 98%   BMI 23.96 kg/m  BP Readings from Last 3 Encounters:  09/12/23 132/82  04/28/23 137/80  04/23/22 119/75   Wt Readings from Last 3 Encounters:  09/12/23 167 lb (75.8 kg)  04/28/23 188 lb (85.3 kg)  04/23/22 237 lb 6.4 oz (107.7 kg)      Physical Exam Vitals and nursing note reviewed.  Constitutional:      Appearance: Normal appearance.  HENT:     Head: Normocephalic.     Right Ear: Tympanic membrane, ear canal and external ear normal.     Left Ear: Tympanic membrane, ear canal and external ear normal.     Nose: Nose normal.     Mouth/Throat:     Mouth: Mucous membranes are moist.     Pharynx: Oropharynx is clear.  Eyes:     Extraocular Movements: Extraocular movements intact.     Pupils: Pupils are equal, round, and reactive to light.  Cardiovascular:     Rate and Rhythm: Normal rate and regular rhythm.  Pulmonary:     Effort: Pulmonary effort is normal.     Breath sounds: Normal breath sounds.  Musculoskeletal:     Cervical back: Normal range of motion and neck supple.  Skin:    General: Skin is warm and dry.  Neurological:     Mental Status: She is alert and oriented to person, place, and time.   Psychiatric:        Mood and Affect: Mood normal.        Thought Content: Thought content normal.      No results found for any visits on 09/12/23.    The ASCVD Risk score (Arnett DK, et al., 2019) failed to calculate for the following reasons:   The 2019 ASCVD risk score is only valid for ages 5 to 69    Assessment & Plan:   Problem List Items Addressed This Visit       Respiratory   Mild intermittent asthma without complication - Primary   Renew singulair .        Relevant Medications   montelukast  (SINGULAIR ) 10 MG tablet     Other   Herpes simplex   Add Valtrex  1000 mg for acute outbreak.        Other Visit Diagnoses       Strep throat       add Magic mouthwash for ongoing pain associated with strep throat.   Relevant Medications   magic mouthwash (lidocaine , diphenhydrAMINE, alum & mag hydroxide) suspension       No follow-ups on file.    Leita Longs, FNP

## 2023-09-19 DIAGNOSIS — B009 Herpesviral infection, unspecified: Secondary | ICD-10-CM | POA: Insufficient documentation

## 2023-09-19 DIAGNOSIS — J4520 Mild intermittent asthma, uncomplicated: Secondary | ICD-10-CM | POA: Insufficient documentation

## 2023-09-19 NOTE — Assessment & Plan Note (Signed)
Renew singulair

## 2023-09-19 NOTE — Assessment & Plan Note (Signed)
 Add Valtrex  1000 mg for acute outbreak.

## 2023-10-30 ENCOUNTER — Ambulatory Visit: Payer: MEDICAID | Admitting: Family Medicine

## 2023-11-13 ENCOUNTER — Ambulatory Visit: Payer: Self-pay | Admitting: Family Medicine

## 2024-02-09 ENCOUNTER — Ambulatory Visit: Payer: Self-pay | Admitting: Family Medicine
